# Patient Record
Sex: Female | Born: 1965 | Race: Black or African American | Hispanic: No | Marital: Married | Smoking: Never smoker
Health system: Southern US, Community
[De-identification: ages and names within clinical notes are randomized; demographics above are authoritative.]

## PROBLEM LIST (undated history)

## (undated) DIAGNOSIS — N930 Postcoital and contact bleeding: Secondary | ICD-10-CM

## (undated) DIAGNOSIS — K760 Fatty (change of) liver, not elsewhere classified: Secondary | ICD-10-CM

## (undated) DIAGNOSIS — B351 Tinea unguium: Secondary | ICD-10-CM

## (undated) DIAGNOSIS — E782 Mixed hyperlipidemia: Secondary | ICD-10-CM

## (undated) DIAGNOSIS — N760 Acute vaginitis: Secondary | ICD-10-CM

## (undated) DIAGNOSIS — R011 Cardiac murmur, unspecified: Secondary | ICD-10-CM

## (undated) DIAGNOSIS — U071 COVID-19: Secondary | ICD-10-CM

## (undated) DIAGNOSIS — B9689 Other specified bacterial agents as the cause of diseases classified elsewhere: Secondary | ICD-10-CM

## (undated) DIAGNOSIS — D170 Benign lipomatous neoplasm of skin and subcutaneous tissue of head, face and neck: Secondary | ICD-10-CM

## (undated) HISTORY — DX: Other specified bacterial agents as the cause of diseases classified elsewhere: B96.89

## (undated) HISTORY — DX: Other specified bacterial agents as the cause of diseases classified elsewhere: N76.0

## (undated) HISTORY — PX: LIPOMA EXCISION: SHX5283

## (undated) HISTORY — DX: Benign lipomatous neoplasm of skin and subcutaneous tissue of head, face and neck: D17.0

## (undated) HISTORY — DX: Tinea unguium: B35.1

## (undated) HISTORY — PX: ABDOMINAL HYSTERECTOMY: SHX81

## (undated) HISTORY — DX: Postcoital and contact bleeding: N93.0

---

## 2005-09-20 ENCOUNTER — Ambulatory Visit: Payer: Self-pay | Admitting: Family Medicine

## 2005-10-08 ENCOUNTER — Ambulatory Visit: Payer: Self-pay | Admitting: Family Medicine

## 2006-10-10 ENCOUNTER — Ambulatory Visit: Payer: Self-pay

## 2008-05-04 ENCOUNTER — Ambulatory Visit: Payer: Self-pay | Admitting: Family Medicine

## 2008-05-10 ENCOUNTER — Ambulatory Visit: Payer: Self-pay | Admitting: Family Medicine

## 2010-08-23 ENCOUNTER — Ambulatory Visit: Payer: Self-pay | Admitting: Surgery

## 2010-09-04 ENCOUNTER — Ambulatory Visit: Payer: Self-pay | Admitting: Surgery

## 2010-10-24 ENCOUNTER — Ambulatory Visit: Payer: Self-pay | Admitting: Family Medicine

## 2011-10-23 ENCOUNTER — Ambulatory Visit: Payer: Self-pay | Admitting: Family Medicine

## 2011-11-26 ENCOUNTER — Ambulatory Visit: Payer: Self-pay | Admitting: Family Medicine

## 2012-10-15 ENCOUNTER — Encounter (HOSPITAL_COMMUNITY): Payer: Self-pay | Admitting: Emergency Medicine

## 2012-10-15 ENCOUNTER — Emergency Department (HOSPITAL_COMMUNITY)
Admission: EM | Admit: 2012-10-15 | Discharge: 2012-10-15 | Disposition: A | Discharge status: Home / Self Care | Payer: BC Managed Care – PPO | Attending: Emergency Medicine | Admitting: Emergency Medicine

## 2012-10-15 DIAGNOSIS — R55 Syncope and collapse: Secondary | ICD-10-CM | POA: Insufficient documentation

## 2012-10-15 DIAGNOSIS — R011 Cardiac murmur, unspecified: Secondary | ICD-10-CM | POA: Insufficient documentation

## 2012-10-15 DIAGNOSIS — Z791 Long term (current) use of non-steroidal anti-inflammatories (NSAID): Secondary | ICD-10-CM | POA: Insufficient documentation

## 2012-10-15 DIAGNOSIS — Z79899 Other long term (current) drug therapy: Secondary | ICD-10-CM | POA: Insufficient documentation

## 2012-10-15 HISTORY — DX: Cardiac murmur, unspecified: R01.1

## 2012-10-15 LAB — CBC WITH DIFFERENTIAL/PLATELET
Basophils Absolute: 0 10*3/uL (ref 0.0–0.1)
Basophils Relative: 0 % (ref 0–1)
Eosinophils Absolute: 0.4 10*3/uL (ref 0.0–0.7)
Hemoglobin: 12.6 g/dL (ref 12.0–15.0)
MCH: 31.6 pg (ref 26.0–34.0)
MCHC: 33.4 g/dL (ref 30.0–36.0)
Monocytes Absolute: 0.6 10*3/uL (ref 0.1–1.0)
Monocytes Relative: 8 % (ref 3–12)
Neutrophils Relative %: 56 % (ref 43–77)
RDW: 13.7 % (ref 11.5–15.5)

## 2012-10-15 LAB — BASIC METABOLIC PANEL
BUN: 14 mg/dL (ref 6–23)
Creatinine, Ser: 0.98 mg/dL (ref 0.50–1.10)
GFR calc Af Amer: 80 mL/min — ABNORMAL LOW (ref >90)
GFR calc non Af Amer: 69 mL/min — ABNORMAL LOW (ref >90)
Potassium: 3.5 mEq/L (ref 3.5–5.1)

## 2012-10-15 LAB — URINALYSIS, ROUTINE W REFLEX MICROSCOPIC
Ketones, ur: NEGATIVE mg/dL
Nitrite: NEGATIVE
Urobilinogen, UA: 1 mg/dL (ref 0.0–1.0)

## 2012-10-15 LAB — URINE MICROSCOPIC-ADD ON

## 2012-10-15 NOTE — ED Notes (Addendum)
Pt had just eaten lunch when she had a syncopal episode.  Was caught by friend.  Friend stated that she had loc for 20-30 seconds.  Pt states she had a headache earlier in the day, but normal for her since she is on menstrual cycle.  Pt felt nauseous before syncopal episode.  EMS states CBG was within normal limits.  Pt alert oriented at present.  22 ga started by ems

## 2012-10-15 NOTE — ED Provider Notes (Signed)
History     CSN: [id number]  Arrival date & time 10/15/12  1508   First MD Initiated Contact with Patient 10/15/12 1512      Chief Complaint  Patient presents with  . Loss of Consciousness    (Consider location/radiation/quality/duration/timing/severity/associated sxs/prior treatment) HPI.... alleges syncopal spell at work today.  No prodromal illnesses.  Patient was standing when she felt dizzy and slumped to the floor.  Coworker states that she was out for about 20-30 seconds. She spontaneously recovered with no deficits. CBG normal via EMS.   Slight headache.  No chest pain, shortness of breath, stiff neck, obvious neuro deficits. Patient claims to be very healthy and has no chronic medical conditions.  Severity is mild to moderate.  Patient states she did not eat or drink much today.  Past Medical History  Diagnosis Date  . Heart murmur     History reviewed. No pertinent past surgical history.  History reviewed. No pertinent family history.  History  Substance Use Topics  . Smoking status: Never Smoker   . Smokeless tobacco: Not on file  . Alcohol Use: No    OB History    Grav Para Term Preterm Abortions TAB SAB Ect Mult Living                  Review of Systems  All other systems reviewed and are negative.    Allergies  Review of patient's allergies indicates no known allergies.  Home Medications   Current Outpatient Rx  Name Route Sig Dispense Refill  . IBUPROFEN 200 MG PO TABS Oral Take 200 mg by mouth every 6 (six) hours as needed. Headache    . ADULT MULTIVITAMIN W/MINERALS CH Oral Take 1 tablet by mouth daily.      BP 117/62  Pulse 57  Temp 98 F (36.7 C) (Oral)  Resp 16  SpO2 98%  Physical Exam  Nursing note and vitals reviewed. Constitutional: She is oriented to person, place, and time. She appears well-developed and well-nourished.  HENT:  Head: Normocephalic and atraumatic.  Eyes: Conjunctivae normal and EOM are normal. Pupils are  equal, round, and reactive to light.  Neck: Normal range of motion. Neck supple.  Cardiovascular: Normal rate, regular rhythm and normal heart sounds.   Pulmonary/Chest: Effort normal and breath sounds normal.  Abdominal: Soft. Bowel sounds are normal.  Musculoskeletal: Normal range of motion.  Neurological: She is alert and oriented to person, place, and time.  Skin: Skin is warm and dry.  Psychiatric: She has a normal mood and affect.    ED Course  Procedures (including critical care time)  Labs Reviewed  BASIC METABOLIC PANEL - Abnormal; Notable for the following:    GFR calc non Af Amer 69 (*)     GFR calc Af Amer 80 (*)     All other components within normal limits  URINALYSIS, ROUTINE W REFLEX MICROSCOPIC - Abnormal; Notable for the following:    APPearance CLOUDY (*)     Specific Gravity, Urine 1.043 (*)     Hgb urine dipstick LARGE (*)     Bilirubin Urine SMALL (*)     All other components within normal limits  URINE MICROSCOPIC-ADD ON - Abnormal; Notable for the following:    Squamous Epithelial / LPF FEW (*)     All other components within normal limits  CBC WITH DIFFERENTIAL  PREGNANCY, URINE   No results found.   No diagnosis found.   Date: 10/15/2012  Rate: 57  Rhythm: sinus bradycardia  QRS Axis: normal  Intervals: normal  ST/T Wave abnormalities: normal  Conduction Disutrbances:none  Narrative Interpretation:   Old EKG Reviewed: none available   MDM  Normal physical exam.   This is alert, pleasant, conversant. Good color. No anomalies.   Screening tests including EKG, hemoglobin, chemistry panel normal.        Donnetta Hutching, MD 10/15/12 1711

## 2012-10-15 NOTE — ED Notes (Signed)
Bed:WA19<BR> Expected date:<BR> Expected time:<BR> Means of arrival:<BR> Comments:<BR> ems

## 2012-10-22 ENCOUNTER — Ambulatory Visit: Payer: Self-pay | Admitting: Family Medicine

## 2012-12-23 LAB — HM PAP SMEAR

## 2013-02-03 ENCOUNTER — Ambulatory Visit: Payer: Self-pay | Admitting: Family Medicine

## 2013-10-20 ENCOUNTER — Ambulatory Visit: Payer: Self-pay | Admitting: Obstetrics and Gynecology

## 2013-10-20 LAB — BASIC METABOLIC PANEL
Anion Gap: 2 — ABNORMAL LOW (ref 7–16)
BUN: 11 mg/dL (ref 7–18)
Co2: 31 mmol/L (ref 21–32)
EGFR (Non-African Amer.): >60
Glucose: 89 mg/dL (ref 65–99)
Potassium: 4.3 mmol/L (ref 3.5–5.1)
Sodium: 138 mmol/L (ref 136–145)

## 2013-10-20 LAB — CBC
HGB: 13.2 g/dL (ref 12.0–16.0)
Platelet: 226 10*3/uL (ref 150–440)

## 2013-10-25 ENCOUNTER — Inpatient Hospital Stay: Payer: Self-pay | Admitting: Obstetrics and Gynecology

## 2013-10-26 LAB — CREATININE, SERUM
Creatinine: 0.89 mg/dL (ref 0.60–1.30)
EGFR (Non-African Amer.): >60

## 2013-10-26 LAB — PATHOLOGY REPORT

## 2013-10-26 LAB — HEMOGLOBIN: HGB: 11.4 g/dL — ABNORMAL LOW (ref 12.0–16.0)

## 2015-04-14 NOTE — Op Note (Signed)
PATIENT NAME:  Samantha Velez, Samantha Velez MR#:  [id number] DATE OF BIRTH:  01/25/1966  DATE OF PROCEDURE:  10/25/2013  PREOPERATIVE DIAGNOSIS: Multi-fibroid uterus.   POSTOPERATIVE DIAGNOSES: 1. Multi-fibroid uterus, 30 week size. 2.  Simple left ovarian cyst.   OPERATIVE PROCEDURE: Total abdominal hysterectomy and bilateral salpingectomy.   SURGEON: Alanda Slim. Nahshon Reich, MD  FIRST ASSISTANT: Dr. Marcelline Mates and Leticia Clas, PA-S  ANESTHESIA: General endotracheal.   INDICATIONS: The patient presents for definitive surgery for a multi-fibroid uterus. The patient is status post 6 months of Depo-Lupron therapy with 30% reduction in size.   FINDINGS AT SURGERY: Revealed a multi-fibroid uterus, approximately 30 weeks size. There was a simple left ovarian cyst present. The fallopian tubes were normal bilaterally.   DESCRIPTION OF PROCEDURE: The patient was brought to the operating room where she was placed in the supine position. General endotracheal anesthesia was induced with difficulty. A ChloraPrep abdominal, perineal and intravaginal prep and drape was performed in standard fashion. A Foley catheter was placed into the bladder and was draining clear yellow urine. A midline incision was extended from the subxiphoid region around the umbilicus to just above the symphysis pubis in the midline. The fascia was incised and separated and the peritoneum was entered. The abdomen was explored and the uterus was delivered through the abdominal incision. The above-noted findings were photo documented. Hysterectomy was then performed in standard fashion. The round ligaments were doubly clamped, cut and stick tied using 0 Vicryl suture. These were tagged. The utero-ovarian ligaments were clamped, cut and stick tied using 0 Vicryl suture. The anterior and posterior leafs of the broad ligament were opened and the bladder flap was created through sharp dissection. The uterine vessels were skeletonized. These were doubly clamped, cut  and stick tied using 0 Vicryl suture. Sequentially the cardinal broad ligament complexes were then taken down in standard fashion through a clamping, cutting and stick tying technique. At the level of the uterosacral ligaments, the uterus was amputated from the operative field. This facilitated better exposure of the cervix. The remainder of the cervix was then isolated through the clamping, cutting and stick tying technique down to the cervicovaginal junction. At this point, the cervix was crossclamped and excised from the operative field. The angles of the vagina were closed using Richardson stitches of 0 Vicryl. The intervening cuff was then closed with figure-of-eight sutures of 0 Vicryl. The pelvis was copiously irrigated. Good hemostasis was noted. The bilateral tubes were then removed using a curved Heaney clamp. These pedicles were then stick tied after removing the tubes from the operative field. Copious irrigation was performed. Upon completion of the procedure, all instrumentation was removed from the abdominopelvic cavity. The abdomen was closed in layers using 0 Maxon suture in a continuous manner. Several other additional figure-of-eight sutures of Maxon were also placed to optimize closure. The incision was closed with staples. Pressure dressing was applied. The patient was then awakened, extubated and taken to the recovery room in satisfactory condition. Estimated blood loss was 500 mL. IV fluids were 1600 mL. Urine output was 150 mL. All instrument, needle and sponge counts were verified as correct. The patient did receive Ancef antibiotic prophylaxis. ____________________________ Alanda Slim Matthewjames Petrasek, MD mad:sb D: 10/25/2013 09:55:24 ET T: 10/25/2013 10:51:29 ET JOB#: [id number]  cc: Hassell Done A. Riddhi Grether, MD, <Dictator> Alanda Slim Rael Tilly MD ELECTRONICALLY SIGNED 10/30/2013 20:04

## 2015-05-02 ENCOUNTER — Other Ambulatory Visit: Payer: Self-pay

## 2016-01-30 ENCOUNTER — Encounter: Payer: Self-pay | Admitting: Obstetrics and Gynecology

## 2016-10-10 ENCOUNTER — Other Ambulatory Visit: Payer: Self-pay | Admitting: Internal Medicine

## 2016-10-10 DIAGNOSIS — Z1231 Encounter for screening mammogram for malignant neoplasm of breast: Secondary | ICD-10-CM

## 2016-11-12 ENCOUNTER — Ambulatory Visit
Admission: RE | Admit: 2016-11-12 | Discharge: 2016-11-12 | Disposition: A | Discharge status: Home / Self Care | Payer: BC Managed Care – PPO | Source: Ambulatory Visit | Attending: Internal Medicine | Admitting: Internal Medicine

## 2016-11-12 DIAGNOSIS — Z1231 Encounter for screening mammogram for malignant neoplasm of breast: Secondary | ICD-10-CM | POA: Insufficient documentation

## 2017-08-04 ENCOUNTER — Other Ambulatory Visit: Payer: Self-pay | Admitting: Internal Medicine

## 2017-08-04 DIAGNOSIS — Z1231 Encounter for screening mammogram for malignant neoplasm of breast: Secondary | ICD-10-CM

## 2017-10-22 ENCOUNTER — Telehealth: Payer: Self-pay | Admitting: Gastroenterology

## 2017-10-22 ENCOUNTER — Telehealth: Payer: Self-pay

## 2017-10-22 NOTE — Telephone Encounter (Signed)
Patient left a voice message that she is returning a call to schedule her colonoscopy. Please call

## 2017-10-22 NOTE — Telephone Encounter (Signed)
Patients call has been returned LVM for her to call office to schedule colonoscopy.  Thanks [employer]

## 2017-11-17 ENCOUNTER — Ambulatory Visit
Admission: RE | Admit: 2017-11-17 | Discharge: 2017-11-17 | Disposition: A | Discharge status: Home / Self Care | Payer: BC Managed Care – PPO | Source: Ambulatory Visit | Attending: Internal Medicine | Admitting: Internal Medicine

## 2017-11-17 DIAGNOSIS — Z1231 Encounter for screening mammogram for malignant neoplasm of breast: Secondary | ICD-10-CM | POA: Insufficient documentation

## 2018-10-29 ENCOUNTER — Other Ambulatory Visit: Payer: Self-pay | Admitting: Internal Medicine

## 2018-10-29 DIAGNOSIS — Z1231 Encounter for screening mammogram for malignant neoplasm of breast: Secondary | ICD-10-CM

## 2018-11-25 ENCOUNTER — Ambulatory Visit
Admission: RE | Admit: 2018-11-25 | Discharge: 2018-11-25 | Disposition: A | Discharge status: Home / Self Care | Payer: BC Managed Care – PPO | Source: Ambulatory Visit | Attending: Internal Medicine | Admitting: Internal Medicine

## 2018-11-25 DIAGNOSIS — Z1231 Encounter for screening mammogram for malignant neoplasm of breast: Secondary | ICD-10-CM | POA: Insufficient documentation

## 2020-01-03 ENCOUNTER — Other Ambulatory Visit: Payer: Self-pay | Admitting: Internal Medicine

## 2020-01-03 DIAGNOSIS — Z1231 Encounter for screening mammogram for malignant neoplasm of breast: Secondary | ICD-10-CM

## 2020-01-24 ENCOUNTER — Ambulatory Visit
Admission: RE | Admit: 2020-01-24 | Discharge: 2020-01-24 | Disposition: A | Discharge status: Home / Self Care | Payer: BC Managed Care – PPO | Source: Ambulatory Visit | Attending: Internal Medicine | Admitting: Internal Medicine

## 2020-01-24 DIAGNOSIS — Z1231 Encounter for screening mammogram for malignant neoplasm of breast: Secondary | ICD-10-CM | POA: Diagnosis not present

## 2020-03-28 ENCOUNTER — Other Ambulatory Visit: Payer: Self-pay | Admitting: Family

## 2020-03-28 DIAGNOSIS — H47339 Pseudopapilledema of optic disc, unspecified eye: Secondary | ICD-10-CM

## 2020-04-11 ENCOUNTER — Ambulatory Visit
Admission: RE | Admit: 2020-04-11 | Discharge: 2020-04-11 | Disposition: A | Discharge status: Home / Self Care | Payer: BC Managed Care – PPO | Source: Ambulatory Visit | Attending: Family | Admitting: Family

## 2020-04-11 ENCOUNTER — Other Ambulatory Visit: Payer: Self-pay

## 2020-04-11 DIAGNOSIS — H47339 Pseudopapilledema of optic disc, unspecified eye: Secondary | ICD-10-CM | POA: Insufficient documentation

## 2020-04-11 MED ORDER — GADOBUTROL 1 MMOL/ML IV SOLN
10.0000 mL | Freq: Once | INTRAVENOUS | Status: AC | PRN
  Administered 2020-04-11: 14:00:00 10 mL via INTRAVENOUS

## 2021-02-06 ENCOUNTER — Other Ambulatory Visit: Payer: Self-pay | Admitting: Internal Medicine

## 2021-02-06 DIAGNOSIS — Z1231 Encounter for screening mammogram for malignant neoplasm of breast: Secondary | ICD-10-CM

## 2021-02-27 ENCOUNTER — Ambulatory Visit
Admission: RE | Admit: 2021-02-27 | Discharge: 2021-02-27 | Disposition: A | Discharge status: Home / Self Care | Payer: BC Managed Care – PPO | Source: Ambulatory Visit | Attending: Internal Medicine | Admitting: Internal Medicine

## 2021-02-27 ENCOUNTER — Other Ambulatory Visit: Payer: Self-pay

## 2021-02-27 DIAGNOSIS — Z1231 Encounter for screening mammogram for malignant neoplasm of breast: Secondary | ICD-10-CM | POA: Diagnosis present

## 2022-05-07 ENCOUNTER — Other Ambulatory Visit: Payer: Self-pay | Admitting: Internal Medicine

## 2022-05-07 DIAGNOSIS — Z1231 Encounter for screening mammogram for malignant neoplasm of breast: Secondary | ICD-10-CM

## 2022-06-05 ENCOUNTER — Ambulatory Visit
Admission: RE | Admit: 2022-06-05 | Discharge: 2022-06-05 | Disposition: A | Discharge status: Home / Self Care | Payer: BC Managed Care – PPO | Source: Ambulatory Visit | Attending: Internal Medicine | Admitting: Internal Medicine

## 2022-06-05 DIAGNOSIS — Z1231 Encounter for screening mammogram for malignant neoplasm of breast: Secondary | ICD-10-CM | POA: Diagnosis present

## 2022-09-01 ENCOUNTER — Encounter: Payer: Self-pay | Admitting: Internal Medicine

## 2023-04-09 ENCOUNTER — Encounter: Payer: Self-pay | Admitting: Nurse Practitioner

## 2023-04-09 ENCOUNTER — Ambulatory Visit (INDEPENDENT_AMBULATORY_CARE_PROVIDER_SITE_OTHER): Payer: BC Managed Care – PPO | Admitting: Nurse Practitioner

## 2023-04-09 VITALS — BP 150/90 | HR 78 | Ht 65.0 in | Wt 241.6 lb

## 2023-04-09 DIAGNOSIS — H1033 Unspecified acute conjunctivitis, bilateral: Secondary | ICD-10-CM | POA: Diagnosis not present

## 2023-04-09 MED ORDER — TOBRAMYCIN-DEXAMETHASONE 0.3-0.1 % OP SUSP: 1.0000 [drp] | Freq: Four times a day (QID) | OPHTHALMIC | 0 refills | Status: DC

## 2023-04-09 NOTE — Patient Instructions (Signed)
1) washcloths and pillowcases switched out daily 2) eye drop

## 2023-04-09 NOTE — Progress Notes (Signed)
Established Patient Office Visit  Subjective:  Patient ID: Samantha Velez, female    DOB: [birth year]  Age: 56 y.o. MRN: [id number]  Chief Complaint  Patient presents with   Acute Visit    Eyes swollen and crusty    Acute visit, bilateral sclera pink when patient woke up this AM.  Both eyes were crusted over when she awoke. Draining some serous drainage from both eyes.  Right eye is sore but not painful.      No other concerns at this time.   Past Medical History:  Diagnosis Date   BV (bacterial vaginosis)    Heart murmur    Lipoma, face    Onychomycosis    Postcoital bleeding     Past Surgical History:  Procedure Laterality Date   ABDOMINAL HYSTERECTOMY     [place] bilateral salpingectomy   LIPOMA EXCISION     back    Social History   Socioeconomic History   Marital status: Married    Spouse name: Not on file   Number of children: Not on file   Years of education: Not on file   Highest education level: Not on file  Occupational History   Not on file  Tobacco Use   Smoking status: Never   Smokeless tobacco: Not on file  Substance and Sexual Activity   Alcohol use: No   Drug use: No   Sexual activity: Yes    Birth control/protection: Surgical  Other Topics Concern   Not on file  Social History Narrative   Not on file   Social Determinants of Health   Financial Resource Strain: Not on file  Food Insecurity: Not on file  Transportation Needs: Not on file  Physical Activity: Not on file  Stress: Not on file  Social Connections: Not on file  Intimate Partner Violence: Not on file    Family History  Problem Relation Age of Onset   Hypertension Father    Breast cancer Paternal Aunt    Colon cancer Paternal Grandfather    Ovarian cancer Neg Hx     No Known Allergies  Review of Systems  Constitutional: Negative.   HENT: Negative.    Eyes:  Positive for photophobia, pain, discharge and redness.  Respiratory: Negative.    Cardiovascular: Negative.    Gastrointestinal: Negative.   Genitourinary: Negative.   Musculoskeletal: Negative.   Skin: Negative.   Neurological: Negative.   Endo/Heme/Allergies: Negative.   Psychiatric/Behavioral: Negative.         Objective:   BP (!) 150/90   Pulse 78   Ht  (1.651 m)   Wt 241 lb 9.6 oz (109.6 kg)   SpO2 97%   BMI 40.20 kg/m   Vitals:   04/09/23 1021  BP: (!) 150/90  Pulse: 78  Height:  (1.651 m)  Weight: 241 lb 9.6 oz (109.6 kg)  SpO2: 97%  BMI (Calculated): 40.2    Physical Exam Vitals reviewed.  Constitutional:      Appearance: Normal appearance.  HENT:     Head: Normocephalic.     Nose: Nose normal.     Mouth/Throat:     Mouth: Mucous membranes are moist.  Eyes:     General:        Right eye: Discharge present.        Left eye: Discharge present. Cardiovascular:     Rate and Rhythm: Normal rate and regular rhythm.  Pulmonary:     Effort: Pulmonary effort is normal.  Breath sounds: Normal breath sounds.  Abdominal:     General: Bowel sounds are normal.     Palpations: Abdomen is soft.  Musculoskeletal:        General: Normal range of motion.     Cervical back: Normal range of motion and neck supple.  Skin:    General: Skin is warm and dry.  Neurological:     Mental Status: She is alert and oriented to person, place, and time.  Psychiatric:        Mood and Affect: Mood normal.        Behavior: Behavior normal.      No results found for any visits on 04/09/23.  No results found for this or any previous visit (from the past 2160 hour(s)).    Assessment & Plan:   Problem List Items Addressed This Visit       Other   Acute bacterial conjunctivitis of both eyes - Primary   Relevant Medications   tobramycin-dexamethasone (TOBRADEX) ophthalmic solution    No follow-ups on file.   Total time spent: 15 minutes  Orson Eva, NP  04/09/2023

## 2023-05-01 ENCOUNTER — Ambulatory Visit (INDEPENDENT_AMBULATORY_CARE_PROVIDER_SITE_OTHER): Payer: BC Managed Care – PPO

## 2023-05-01 ENCOUNTER — Encounter: Payer: Self-pay | Admitting: Internal Medicine

## 2023-05-01 ENCOUNTER — Other Ambulatory Visit: Payer: Self-pay | Admitting: Internal Medicine

## 2023-05-01 ENCOUNTER — Ambulatory Visit: Payer: BC Managed Care – PPO | Admitting: Internal Medicine

## 2023-05-01 VITALS — BP 130/68 | HR 73 | Ht 65.0 in | Wt 240.0 lb

## 2023-05-01 DIAGNOSIS — R1011 Right upper quadrant pain: Secondary | ICD-10-CM

## 2023-05-01 DIAGNOSIS — R1084 Generalized abdominal pain: Secondary | ICD-10-CM

## 2023-05-01 DIAGNOSIS — Z1389 Encounter for screening for other disorder: Secondary | ICD-10-CM

## 2023-05-01 DIAGNOSIS — E669 Obesity, unspecified: Secondary | ICD-10-CM

## 2023-05-01 DIAGNOSIS — K5909 Other constipation: Secondary | ICD-10-CM

## 2023-05-01 DIAGNOSIS — E782 Mixed hyperlipidemia: Secondary | ICD-10-CM

## 2023-05-01 DIAGNOSIS — R030 Elevated blood-pressure reading, without diagnosis of hypertension: Secondary | ICD-10-CM | POA: Insufficient documentation

## 2023-05-01 LAB — POCT URINALYSIS DIPSTICK
Blood, UA: NEGATIVE
Glucose, UA: NEGATIVE
Leukocytes, UA: NEGATIVE
Nitrite, UA: NEGATIVE
Protein, UA: POSITIVE — AB
Spec Grav, UA: 1.015 (ref 1.010–1.025)
Urobilinogen, UA: 2 E.U./dL — AB
pH, UA: 7 (ref 5.0–8.0)

## 2023-05-01 NOTE — Progress Notes (Signed)
Established Patient Office Visit  Subjective:  Patient ID: Samantha Velez, female    DOB: Feb 25, 1966  Age: 57 y.o. MRN: [id number]  Chief Complaint  Patient presents with   Follow-up    Abdominal pain/dark urine    Patient not in since 2021.  She has been doing fine except for COVID infection in September 2023. Today she comes in with 3-day history of abdominal pain which is mostly diffuse and generalized.  She is more uncomfortable than in pain.  She noticed that she has not had a bowel movement for the last 2 days and she is usually very regular every day.  Although she is passing gas.  She denies any nausea or vomiting, no fevers and chills, and no diarrhea or urinary complaints.  Her appetite is normal. She did not eat anything unusual and nobody else in her household is sick. Currently she is not taking any medications except for multivitamin.  No change in diet or supplements. Patient has gained weight since her last visit.  Her blood pressure is still borderline high. Will check x-ray abdomen, and blood work.    No other concerns at this time.   Past Medical History:  Diagnosis Date   BV (bacterial vaginosis)    Heart murmur    Lipoma, face    Onychomycosis    Postcoital bleeding     Past Surgical History:  Procedure Laterality Date   ABDOMINAL HYSTERECTOMY     [place] bilateral salpingectomy   LIPOMA EXCISION     back    Social History   Socioeconomic History   Marital status: Married    Spouse name: Not on file   Number of children: Not on file   Years of education: Not on file   Highest education level: Not on file  Occupational History   Not on file  Tobacco Use   Smoking status: Never   Smokeless tobacco: Not on file  Substance and Sexual Activity   Alcohol use: No   Drug use: No   Sexual activity: Yes    Birth control/protection: Surgical  Other Topics Concern   Not on file  Social History Narrative   Not on file   Social Determinants of Health    Financial Resource Strain: Not on file  Food Insecurity: Not on file  Transportation Needs: Not on file  Physical Activity: Not on file  Stress: Not on file  Social Connections: Not on file  Intimate Partner Violence: Not on file    Family History  Problem Relation Age of Onset   Hypertension Father    Breast cancer Paternal Aunt    Colon cancer Paternal Grandfather    Ovarian cancer Neg Hx     No Known Allergies  Review of Systems  Constitutional:  Negative for fever.  HENT: Negative.    Eyes: Negative.   Respiratory:  Negative for cough, shortness of breath and wheezing.   Cardiovascular:  Negative for chest pain, leg swelling and PND.  Gastrointestinal:  Positive for abdominal pain and constipation. Negative for blood in stool, diarrhea, heartburn, melena, nausea and vomiting.  Genitourinary:  Negative for dysuria, flank pain, hematuria and urgency.  Musculoskeletal:  Negative for back pain, falls, joint pain, myalgias and neck pain.  Neurological:  Negative for dizziness, tingling, tremors, weakness and headaches.  Psychiatric/Behavioral:  Negative for depression. The patient is not nervous/anxious and does not have insomnia.        Objective:   BP 130/68   Pulse  73   Ht 5\' 5"  (1.651 m)   Wt 240 lb (108.9 kg)   SpO2 96%   BMI 39.94 kg/m   Vitals:   05/01/23 1050  BP: 130/68  Pulse: 73  Height: 5\' 5"  (1.651 m)  Weight: 240 lb (108.9 kg)  SpO2: 96%  BMI (Calculated): 39.94    Physical Exam Vitals and nursing note reviewed.  Constitutional:      Appearance: She is obese.  Cardiovascular:     Rate and Rhythm: Normal rate and regular rhythm.     Pulses: Normal pulses.     Heart sounds: Normal heart sounds. No murmur heard. Pulmonary:     Effort: Pulmonary effort is normal.     Breath sounds: Normal breath sounds. No wheezing, rhonchi or rales.  Abdominal:     General: Bowel sounds are normal. There is no distension.     Palpations: Abdomen is  soft. There is no mass.     Tenderness: There is abdominal tenderness (mild diffuse tenderness.). There is no right CVA tenderness, left CVA tenderness, guarding or rebound.     Hernia: No hernia is present.  Musculoskeletal:        General: Normal range of motion.     Cervical back: Normal range of motion and neck supple.     Right lower leg: No edema.     Left lower leg: No edema.  Skin:    General: Skin is warm.  Neurological:     General: No focal deficit present.     Mental Status: She is alert and oriented to person, place, and time.  Psychiatric:        Mood and Affect: Mood normal.        Behavior: Behavior normal.      Results for orders placed or performed in visit on 05/01/23  POCT Urinalysis Dipstick (81002)  Result Value Ref Range   Color, UA     Clarity, UA     Glucose, UA Negative Negative   Bilirubin, UA Moderate    Ketones, UA 15mg /dl    Spec Grav, UA 1.610 1.010 - 1.025   Blood, UA Negative    pH, UA 7.0 5.0 - 8.0   Protein, UA Positive (A) Negative   Urobilinogen, UA 2.0 (A) 0.2 or 1.0 E.U./dL   Nitrite, UA Negative    Leukocytes, UA Negative Negative   Appearance     Odor      Recent Results (from the past 2160 hour(s))  POCT Urinalysis Dipstick (96045)     Status: Abnormal   Collection Time: 05/01/23 10:56 AM  Result Value Ref Range   Color, UA     Clarity, UA     Glucose, UA Negative Negative   Bilirubin, UA Moderate    Ketones, UA 15mg /dl    Spec Grav, UA 4.098 1.010 - 1.025   Blood, UA Negative    pH, UA 7.0 5.0 - 8.0   Protein, UA Positive (A) Negative    Comment: Trace   Urobilinogen, UA 2.0 (A) 0.2 or 1.0 E.U./dL   Nitrite, UA Negative    Leukocytes, UA Negative Negative   Appearance     Odor        Assessment & Plan:  Patient had her abdominal x-ray done in the office which shows that she is negative for bowel obstruction, perforation and constipation however there is possible cholelithiasis as a 3.5 cm ovoid structure is seen  in the right upper quadrant.  This information was  shared with the patient on the phone.  An abdominal ultrasound is being scheduled.  Patient will take oral stool softeners, drink plenty of fluids and keep her diet simple.  She will let us know if anything changes. Problem List Items Addressed This Visit     Generalized abdominal pain - Primary   Relevant Orders   CBC With Differential   CMP14+EGFR   Lipase   Mixed hyperlipidemia   Relevant Orders   Lipid Panel w/o Chol/HDL Ratio   Prehypertension   Obesity (BMI 30-39.9)   Other constipation   Relevant Orders   DG Abd 2 Views (Completed)   CMP14+EGFR   Other Visit Diagnoses     Screening for blood or protein in urine       Relevant Orders   POCT Urinalysis Dipstick (16109) (Completed)   Right upper quadrant abdominal pain       Relevant Orders   US Abdomen Limited RUQ (LIVER/GB)       Return in about 1 week (around 05/08/2023).   Total time spent: 30 minutes  Margaretann Loveless, MD  05/01/2023

## 2023-05-02 LAB — CBC WITH DIFFERENTIAL
Basophils Absolute: 0 10*3/uL (ref 0.0–0.2)
Basos: 0 %
EOS (ABSOLUTE): 0.2 10*3/uL (ref 0.0–0.4)
Eos: 2 %
Hematocrit: 38.9 % (ref 34.0–46.6)
Hemoglobin: 12.7 g/dL (ref 11.1–15.9)
Immature Grans (Abs): 0.1 10*3/uL (ref 0.0–0.1)
Immature Granulocytes: 1 %
Lymphocytes Absolute: 2.5 10*3/uL (ref 0.7–3.1)
Lymphs: 32 %
MCH: 30.2 pg (ref 26.6–33.0)
MCHC: 32.6 g/dL (ref 31.5–35.7)
MCV: 93 fL (ref 79–97)
Monocytes Absolute: 0.6 10*3/uL (ref 0.1–0.9)
Monocytes: 8 %
Neutrophils Absolute: 4.5 10*3/uL (ref 1.4–7.0)
Neutrophils: 57 %
RBC: 4.2 x10E6/uL (ref 3.77–5.28)
RDW: 13.6 % (ref 11.7–15.4)
WBC: 7.8 10*3/uL (ref 3.4–10.8)

## 2023-05-02 LAB — LIPID PANEL W/O CHOL/HDL RATIO
Cholesterol, Total: 248 mg/dL — ABNORMAL HIGH (ref 100–199)
HDL: 57 mg/dL (ref >39)
LDL Chol Calc (NIH): 178 mg/dL — ABNORMAL HIGH (ref 0–99)
Triglycerides: 76 mg/dL (ref 0–149)
VLDL Cholesterol Cal: 13 mg/dL (ref 5–40)

## 2023-05-02 LAB — CMP14+EGFR
ALT: 692 IU/L (ref 0–32)
AST: 435 IU/L — ABNORMAL HIGH (ref 0–40)
Albumin/Globulin Ratio: 1.5 (ref 1.2–2.2)
Albumin: 4 g/dL (ref 3.8–4.9)
Alkaline Phosphatase: 254 IU/L — ABNORMAL HIGH (ref 44–121)
BUN/Creatinine Ratio: 9 (ref 9–23)
BUN: 7 mg/dL (ref 6–24)
Bilirubin Total: 2.7 mg/dL — ABNORMAL HIGH (ref 0.0–1.2)
CO2: 21 mmol/L (ref 20–29)
Calcium: 9.5 mg/dL (ref 8.7–10.2)
Chloride: 103 mmol/L (ref 96–106)
Creatinine, Ser: 0.81 mg/dL (ref 0.57–1.00)
Globulin, Total: 2.7 g/dL (ref 1.5–4.5)
Glucose: 91 mg/dL (ref 70–99)
Potassium: 3.9 mmol/L (ref 3.5–5.2)
Sodium: 141 mmol/L (ref 134–144)
Total Protein: 6.7 g/dL (ref 6.0–8.5)
eGFR: 86 mL/min/{1.73_m2} (ref >59)

## 2023-05-02 LAB — LIPASE: Lipase: 20 U/L (ref 14–72)

## 2023-05-05 ENCOUNTER — Ambulatory Visit
Admission: RE | Admit: 2023-05-05 | Discharge: 2023-05-05 | Disposition: A | Discharge status: Home / Self Care | Payer: BC Managed Care – PPO | Source: Ambulatory Visit | Attending: Internal Medicine | Admitting: Internal Medicine

## 2023-05-05 ENCOUNTER — Other Ambulatory Visit: Payer: Self-pay | Admitting: Internal Medicine

## 2023-05-05 DIAGNOSIS — R748 Abnormal levels of other serum enzymes: Secondary | ICD-10-CM

## 2023-05-05 DIAGNOSIS — R1011 Right upper quadrant pain: Secondary | ICD-10-CM | POA: Diagnosis present

## 2023-05-07 ENCOUNTER — Telehealth: Payer: Self-pay | Admitting: Internal Medicine

## 2023-05-07 NOTE — Telephone Encounter (Signed)
Patient returned call regarding her U/S results. Advised her of Dr. Milta Deiters message. Patient will come for labs again on Monday, 5/20 when she comes for her appt. Advised her we are referring her to [facility] for surgical consult. Patient verbalized understanding.

## 2023-05-12 ENCOUNTER — Encounter: Payer: Self-pay | Admitting: Internal Medicine

## 2023-05-12 ENCOUNTER — Ambulatory Visit (INDEPENDENT_AMBULATORY_CARE_PROVIDER_SITE_OTHER): Payer: BC Managed Care – PPO | Admitting: Internal Medicine

## 2023-05-12 VITALS — BP 124/80 | HR 71 | Ht 65.0 in | Wt 240.0 lb

## 2023-05-12 DIAGNOSIS — E669 Obesity, unspecified: Secondary | ICD-10-CM

## 2023-05-12 DIAGNOSIS — E782 Mixed hyperlipidemia: Secondary | ICD-10-CM

## 2023-05-12 DIAGNOSIS — R1084 Generalized abdominal pain: Secondary | ICD-10-CM

## 2023-05-12 DIAGNOSIS — R748 Abnormal levels of other serum enzymes: Secondary | ICD-10-CM | POA: Insufficient documentation

## 2023-05-12 NOTE — Progress Notes (Signed)
Established Patient Office Visit  Subjective:  Patient ID: Samantha Velez, female    DOB: 13-May-1966  Age: 57 y.o. MRN: [id number]  Chief Complaint  Patient presents with   Follow-up    1 week follow up    Patient comes in for follow-up from her last visit when she had presented with abdominal discomfort and generalized bloating and constipation.  Initial x-ray of the abdomen had shown no obstruction but there was a large gallstone.  Patient was scheduled for abdominal ultrasound which still showed a gallstone and a 2.5 cm liver cyst, and fatty liver.  There was very mild thickening of the gallbladder but not acute cholecystitis.  However her liver enzymes were markedly elevated.  As well as her LDL.  More labs were ordered which patient will get done today including repeat LFTs. Today patient is feeling better she had a good bowel movement with using Dulcolax tablets so she is not constipated anymore.  She is not bloated but she still feels some generalized discomfort in her abdomen. Will consider HIDA scan and a surgical consult after we get the lab results back. She has no nausea vomiting.  No fevers or chills.  No diarrhea.  Her appetite is good. Patient needs to start a very strict diet control for cholesterol , will also consider adding statin.  Suspect NAFLD.    No other concerns at this time.   Past Medical History:  Diagnosis Date   BV (bacterial vaginosis)    Heart murmur    Lipoma, face    Onychomycosis    Postcoital bleeding     Past Surgical History:  Procedure Laterality Date   ABDOMINAL HYSTERECTOMY     [place] bilateral salpingectomy   LIPOMA EXCISION     back    Social History   Socioeconomic History   Marital status: Married    Spouse name: Not on file   Number of children: Not on file   Years of education: Not on file   Highest education level: Not on file  Occupational History   Not on file  Tobacco Use   Smoking status: Never   Smokeless tobacco:  Not on file  Substance and Sexual Activity   Alcohol use: No   Drug use: No   Sexual activity: Yes    Birth control/protection: Surgical  Other Topics Concern   Not on file  Social History Narrative   Not on file   Social Determinants of Health   Financial Resource Strain: Not on file  Food Insecurity: Not on file  Transportation Needs: Not on file  Physical Activity: Not on file  Stress: Not on file  Social Connections: Not on file  Intimate Partner Violence: Not on file    Family History  Problem Relation Age of Onset   Hypertension Father    Breast cancer Paternal Aunt    Colon cancer Paternal Grandfather    Ovarian cancer Neg Hx     No Known Allergies  Review of Systems  Constitutional:  Negative for chills, diaphoresis, fever, malaise/fatigue and weight loss.  HENT:  Negative for congestion, hearing loss, sinus pain, sore throat and tinnitus.   Eyes: Negative.   Respiratory:  Negative for cough, shortness of breath and wheezing.   Cardiovascular:  Negative for chest pain, leg swelling and PND.  Gastrointestinal:  Negative for abdominal pain, blood in stool, constipation, diarrhea, heartburn, melena, nausea and vomiting.  Musculoskeletal:  Negative for back pain, falls, joint pain, myalgias and neck  pain.  Neurological:  Negative for dizziness.  Psychiatric/Behavioral: Negative.         Objective:   BP 124/80   Pulse 71   Ht 5\' 5"  (1.651 m)   Wt 240 lb (108.9 kg)   SpO2 97%   BMI 39.94 kg/m   Vitals:   05/12/23 1443  BP: 124/80  Pulse: 71  Height: 5\' 5"  (1.651 m)  Weight: 240 lb (108.9 kg)  SpO2: 97%  BMI (Calculated): 39.94    Physical Exam Vitals and nursing note reviewed.  Constitutional:      Appearance: Normal appearance. She is obese.  Cardiovascular:     Rate and Rhythm: Normal rate and regular rhythm.     Pulses: Normal pulses.     Heart sounds: Normal heart sounds.  Pulmonary:     Effort: Pulmonary effort is normal.     Breath  sounds: Normal breath sounds. No wheezing, rhonchi or rales.  Abdominal:     General: Bowel sounds are normal. There is no distension.     Palpations: Abdomen is soft. There is no mass.     Tenderness: There is no abdominal tenderness. There is no right CVA tenderness, left CVA tenderness, guarding or rebound.     Hernia: No hernia is present.  Musculoskeletal:        General: No swelling, deformity or signs of injury. Normal range of motion.     Cervical back: Normal range of motion and neck supple.     Right lower leg: No edema.     Left lower leg: No edema.  Skin:    General: Skin is warm.  Neurological:     General: No focal deficit present.     Mental Status: She is alert and oriented to person, place, and time.  Psychiatric:        Mood and Affect: Mood normal.      No results found for any visits on 05/12/23.  Recent Results (from the past 2160 hour(s))  POCT Urinalysis Dipstick (09811)     Status: Abnormal   Collection Time: 05/01/23 10:56 AM  Result Value Ref Range   Color, UA     Clarity, UA     Glucose, UA Negative Negative   Bilirubin, UA Moderate    Ketones, UA 15mg /dl    Spec Grav, UA 9.147 1.010 - 1.025   Blood, UA Negative    pH, UA 7.0 5.0 - 8.0   Protein, UA Positive (A) Negative    Comment: Trace   Urobilinogen, UA 2.0 (A) 0.2 or 1.0 E.U./dL   Nitrite, UA Negative    Leukocytes, UA Negative Negative   Appearance     Odor    CBC With Differential     Status: None   Collection Time: 05/01/23 11:31 AM  Result Value Ref Range   WBC 7.8 3.4 - 10.8 x10E3/uL   RBC 4.20 3.77 - 5.28 x10E6/uL   Hemoglobin 12.7 11.1 - 15.9 g/dL   Hematocrit 82.9 56.2 - 46.6 %   MCV 93 79 - 97 fL   MCH 30.2 26.6 - 33.0 pg   MCHC 32.6 31.5 - 35.7 g/dL   RDW 13.0 86.5 - 78.4 %   Neutrophils 57 Not Estab. %   Lymphs 32 Not Estab. %   Monocytes 8 Not Estab. %   Eos 2 Not Estab. %   Basos 0 Not Estab. %   Neutrophils Absolute 4.5 1.4 - 7.0 x10E3/uL   Lymphocytes Absolute  2.5 0.7 -  3.1 x10E3/uL   Monocytes Absolute 0.6 0.1 - 0.9 x10E3/uL   EOS (ABSOLUTE) 0.2 0.0 - 0.4 x10E3/uL   Basophils Absolute 0.0 0.0 - 0.2 x10E3/uL   Immature Granulocytes 1 Not Estab. %   Immature Grans (Abs) 0.1 0.0 - 0.1 x10E3/uL  CMP14+EGFR     Status: Abnormal   Collection Time: 05/01/23 11:31 AM  Result Value Ref Range   Glucose 91 70 - 99 mg/dL   BUN 7 6 - 24 mg/dL   Creatinine, Ser 1.91 0.57 - 1.00 mg/dL   eGFR 86 >47 WG/NFA/2.13   BUN/Creatinine Ratio 9 9 - 23   Sodium 141 134 - 144 mmol/L   Potassium 3.9 3.5 - 5.2 mmol/L   Chloride 103 96 - 106 mmol/L   CO2 21 20 - 29 mmol/L   Calcium 9.5 8.7 - 10.2 mg/dL   Total Protein 6.7 6.0 - 8.5 g/dL   Albumin 4.0 3.8 - 4.9 g/dL   Globulin, Total 2.7 1.5 - 4.5 g/dL   Albumin/Globulin Ratio 1.5 1.2 - 2.2   Bilirubin Total 2.7 (H) 0.0 - 1.2 mg/dL   Alkaline Phosphatase 254 (H) 44 - 121 IU/L   AST 435 (H) 0 - 40 IU/L   ALT 692 (HH) 0 - 32 IU/L  Lipase     Status: None   Collection Time: 05/01/23 11:31 AM  Result Value Ref Range   Lipase 20 14 - 72 U/L  Lipid Panel w/o Chol/HDL Ratio     Status: Abnormal   Collection Time: 05/01/23 11:31 AM  Result Value Ref Range   Cholesterol, Total 248 (H) 100 - 199 mg/dL   Triglycerides 76 0 - 149 mg/dL   HDL 57 >08 mg/dL   VLDL Cholesterol Cal 13 5 - 40 mg/dL   LDL Chol Calc (NIH) 657 (H) 0 - 99 mg/dL      Assessment & Plan:  Repeat labs today.  Consider HIDA scan.  May need surgical and gastroenterology consult. Problem List Items Addressed This Visit     Generalized abdominal pain   Mixed hyperlipidemia   Obesity (BMI 30-39.9)   Abnormal liver enzymes - Primary    Return in about 4 weeks (around 06/09/2023).   Total time spent: 30 minutes  Margaretann Loveless, MD  05/12/2023   This document may have been prepared by [facility] Voice Recognition software and as such may include unintentional dictation errors.

## 2023-05-13 ENCOUNTER — Other Ambulatory Visit: Payer: Self-pay | Admitting: Internal Medicine

## 2023-05-13 DIAGNOSIS — R748 Abnormal levels of other serum enzymes: Secondary | ICD-10-CM

## 2023-05-13 DIAGNOSIS — R1013 Epigastric pain: Secondary | ICD-10-CM

## 2023-05-13 LAB — ACUTE HEP PANEL AND HEP B SURFACE AB
Hep A IgM: NEGATIVE
Hep B C IgM: NEGATIVE
Hep C Virus Ab: NONREACTIVE
Hepatitis B Surf Ab Quant: 9093 m[IU]/mL (ref >9.9)
Hepatitis B Surface Ag: NEGATIVE

## 2023-05-13 LAB — HEPATIC FUNCTION PANEL
ALT: 63 IU/L — ABNORMAL HIGH (ref 0–32)
AST: 23 IU/L (ref 0–40)
Albumin: 4.2 g/dL (ref 3.8–4.9)
Alkaline Phosphatase: 142 IU/L — ABNORMAL HIGH (ref 44–121)
Bilirubin Total: 0.3 mg/dL (ref 0.0–1.2)
Bilirubin, Direct: 0.13 mg/dL (ref 0.00–0.40)
Total Protein: 7.1 g/dL (ref 6.0–8.5)

## 2023-05-13 MED ORDER — PANTOPRAZOLE SODIUM 40 MG PO TBEC: 40.0000 mg | DELAYED_RELEASE_TABLET | Freq: Every day | ORAL | 0 refills | Status: DC

## 2023-06-02 ENCOUNTER — Other Ambulatory Visit: Payer: BC Managed Care – PPO

## 2023-06-03 ENCOUNTER — Ambulatory Visit
Admission: RE | Admit: 2023-06-03 | Discharge: 2023-06-03 | Disposition: A | Discharge status: Home / Self Care | Payer: BC Managed Care – PPO | Source: Ambulatory Visit | Attending: Internal Medicine | Admitting: Internal Medicine

## 2023-06-03 DIAGNOSIS — R748 Abnormal levels of other serum enzymes: Secondary | ICD-10-CM | POA: Diagnosis present

## 2023-06-03 DIAGNOSIS — R1013 Epigastric pain: Secondary | ICD-10-CM | POA: Insufficient documentation

## 2023-06-03 MED ORDER — TECHNETIUM TC 99M MEBROFENIN IV KIT
5.0000 | PACK | Freq: Once | INTRAVENOUS | Status: AC | PRN
  Administered 2023-06-03: 5.24 via INTRAVENOUS

## 2023-06-09 ENCOUNTER — Ambulatory Visit (INDEPENDENT_AMBULATORY_CARE_PROVIDER_SITE_OTHER): Payer: BC Managed Care – PPO | Admitting: Internal Medicine

## 2023-06-09 ENCOUNTER — Encounter: Payer: Self-pay | Admitting: Internal Medicine

## 2023-06-09 VITALS — BP 118/86 | HR 76 | Ht 65.0 in | Wt 236.6 lb

## 2023-06-09 DIAGNOSIS — E782 Mixed hyperlipidemia: Secondary | ICD-10-CM | POA: Diagnosis not present

## 2023-06-09 DIAGNOSIS — K5909 Other constipation: Secondary | ICD-10-CM | POA: Diagnosis not present

## 2023-06-09 DIAGNOSIS — K8020 Calculus of gallbladder without cholecystitis without obstruction: Secondary | ICD-10-CM | POA: Insufficient documentation

## 2023-06-09 MED ORDER — ROSUVASTATIN CALCIUM 10 MG PO TABS: 10.0000 mg | ORAL_TABLET | Freq: Every day | ORAL | 11 refills | Status: DC

## 2023-06-09 NOTE — Progress Notes (Signed)
Established Patient Office Visit  Subjective:  Patient ID: Samantha Velez, female    DOB: 04/26/1966  Age: 57 y.o. MRN: [id number]  Chief Complaint  Patient presents with   Follow-up    4 week follow up    Patient comes in for follow up and to discuss HIDA results. Her Gallbladder EF is normal but delayed filling due to large gallstone.  He recent labs showed improvement in LFTs, but has high LDL. Will start Crestor. Patient already started strict diet control and lost some weight. However will schedule surgical evaluation for gall stones. No n/v ,no chest pain, no diarrhea or constipation.    No other concerns at this time.   Past Medical History:  Diagnosis Date   BV (bacterial vaginosis)    Heart murmur    Lipoma, face    Onychomycosis    Postcoital bleeding     Past Surgical History:  Procedure Laterality Date   ABDOMINAL HYSTERECTOMY     [place] bilateral salpingectomy   LIPOMA EXCISION     back    Social History   Socioeconomic History   Marital status: Married    Spouse name: Not on file   Number of children: Not on file   Years of education: Not on file   Highest education level: Not on file  Occupational History   Not on file  Tobacco Use   Smoking status: Never   Smokeless tobacco: Not on file  Substance and Sexual Activity   Alcohol use: No   Drug use: No   Sexual activity: Yes    Birth control/protection: Surgical  Other Topics Concern   Not on file  Social History Narrative   Not on file   Social Determinants of Health   Financial Resource Strain: Not on file  Food Insecurity: Not on file  Transportation Needs: Not on file  Physical Activity: Not on file  Stress: Not on file  Social Connections: Not on file  Intimate Partner Violence: Not on file    Family History  Problem Relation Age of Onset   Hypertension Father    Breast cancer Paternal Aunt    Colon cancer Paternal Grandfather    Ovarian cancer Neg Hx     No Known  Allergies  Review of Systems  Constitutional:  Negative for chills, diaphoresis, fever and malaise/fatigue.  HENT: Negative.    Eyes: Negative.   Respiratory:  Negative for cough, shortness of breath and wheezing.   Cardiovascular:  Negative for chest pain, leg swelling and PND.  Gastrointestinal:  Negative for abdominal pain, constipation, heartburn, melena, nausea and vomiting.  Musculoskeletal:  Negative for falls, joint pain and myalgias.  Neurological:  Negative for dizziness, seizures, weakness and headaches.  Psychiatric/Behavioral:  Negative for depression and memory loss. The patient is not nervous/anxious.        Objective:   BP 118/86   Pulse 76   Ht 5\' 5"  (1.651 m)   Wt 236 lb 9.6 oz (107.3 kg)   SpO2 97%   BMI 39.37 kg/m   Vitals:   06/09/23 1458  BP: 118/86  Pulse: 76  Height: 5\' 5"  (1.651 m)  Weight: 236 lb 9.6 oz (107.3 kg)  SpO2: 97%  BMI (Calculated): 39.37    Physical Exam Vitals and nursing note reviewed.  Cardiovascular:     Rate and Rhythm: Normal rate and regular rhythm.     Pulses: Normal pulses.     Heart sounds: Normal heart sounds. No murmur heard.  Pulmonary:     Effort: Pulmonary effort is normal.     Breath sounds: Normal breath sounds. No wheezing or rales.  Abdominal:     General: Bowel sounds are normal.     Palpations: Abdomen is soft. There is no mass.     Tenderness: There is no right CVA tenderness, left CVA tenderness, guarding or rebound.  Musculoskeletal:        General: No tenderness or deformity.     Cervical back: Normal range of motion.     Right lower leg: No edema.     Left lower leg: No edema.  Neurological:     Mental Status: She is alert.  Psychiatric:        Mood and Affect: Mood normal.        Behavior: Behavior normal.      No results found for any visits on 06/09/23.  Recent Results (from the past 2160 hour(s))  POCT Urinalysis Dipstick (16109)     Status: Abnormal   Collection Time: 05/01/23 10:56  AM  Result Value Ref Range   Color, UA     Clarity, UA     Glucose, UA Negative Negative   Bilirubin, UA Moderate    Ketones, UA 15mg /dl    Spec Grav, UA 6.045 1.010 - 1.025   Blood, UA Negative    pH, UA 7.0 5.0 - 8.0   Protein, UA Positive (A) Negative    Comment: Trace   Urobilinogen, UA 2.0 (A) 0.2 or 1.0 E.U./dL   Nitrite, UA Negative    Leukocytes, UA Negative Negative   Appearance     Odor    CBC With Differential     Status: None   Collection Time: 05/01/23 11:31 AM  Result Value Ref Range   WBC 7.8 3.4 - 10.8 x10E3/uL   RBC 4.20 3.77 - 5.28 x10E6/uL   Hemoglobin 12.7 11.1 - 15.9 g/dL   Hematocrit 40.9 81.1 - 46.6 %   MCV 93 79 - 97 fL   MCH 30.2 26.6 - 33.0 pg   MCHC 32.6 31.5 - 35.7 g/dL   RDW 91.4 78.2 - 95.6 %   Neutrophils 57 Not Estab. %   Lymphs 32 Not Estab. %   Monocytes 8 Not Estab. %   Eos 2 Not Estab. %   Basos 0 Not Estab. %   Neutrophils Absolute 4.5 1.4 - 7.0 x10E3/uL   Lymphocytes Absolute 2.5 0.7 - 3.1 x10E3/uL   Monocytes Absolute 0.6 0.1 - 0.9 x10E3/uL   EOS (ABSOLUTE) 0.2 0.0 - 0.4 x10E3/uL   Basophils Absolute 0.0 0.0 - 0.2 x10E3/uL   Immature Granulocytes 1 Not Estab. %   Immature Grans (Abs) 0.1 0.0 - 0.1 x10E3/uL  CMP14+EGFR     Status: Abnormal   Collection Time: 05/01/23 11:31 AM  Result Value Ref Range   Glucose 91 70 - 99 mg/dL   BUN 7 6 - 24 mg/dL   Creatinine, Ser 2.13 0.57 - 1.00 mg/dL   eGFR 86 >08 MV/HQI/6.96   BUN/Creatinine Ratio 9 9 - 23   Sodium 141 134 - 144 mmol/L   Potassium 3.9 3.5 - 5.2 mmol/L   Chloride 103 96 - 106 mmol/L   CO2 21 20 - 29 mmol/L   Calcium 9.5 8.7 - 10.2 mg/dL   Total Protein 6.7 6.0 - 8.5 g/dL   Albumin 4.0 3.8 - 4.9 g/dL   Globulin, Total 2.7 1.5 - 4.5 g/dL   Albumin/Globulin Ratio 1.5 1.2 - 2.2  Bilirubin Total 2.7 (H) 0.0 - 1.2 mg/dL   Alkaline Phosphatase 254 (H) 44 - 121 IU/L   AST 435 (H) 0 - 40 IU/L   ALT 692 (HH) 0 - 32 IU/L  Lipase     Status: None   Collection Time: 05/01/23  11:31 AM  Result Value Ref Range   Lipase 20 14 - 72 U/L  Lipid Panel w/o Chol/HDL Ratio     Status: Abnormal   Collection Time: 05/01/23 11:31 AM  Result Value Ref Range   Cholesterol, Total 248 (H) 100 - 199 mg/dL   Triglycerides 76 0 - 149 mg/dL   HDL 57 >16 mg/dL   VLDL Cholesterol Cal 13 5 - 40 mg/dL   LDL Chol Calc (NIH) 109 (H) 0 - 99 mg/dL  Acute Hep Panel & Hep B Surface Ab     Status: None   Collection Time: 05/12/23 11:13 PM  Result Value Ref Range   Hep A IgM Negative Negative   Hepatitis B Surface Ag Negative Negative   Hep B C IgM Negative Negative   Hepatitis B Surf Ab Quant 9,093.0 Immunity>9.9 mIU/mL    Comment: Results confirmed on dilution.   Status of Immunity                     Anti-HBs Level   ------------------                     -------------- Inconsistent with Immunity                   0.0 - 9.9 Consistent with Immunity                          >9.9 **Effective June 23, 2023 the reference interval**   will be changing to: Immunity >10    Hep C Virus Ab Non Reactive Non Reactive    Comment: HCV antibody alone does not differentiate between previously resolved infection and active infection. Equivocal and Reactive HCV antibody results should be followed up with an HCV RNA test to support the diagnosis of active HCV infection.   Liver Profile     Status: Abnormal   Collection Time: 05/12/23 11:13 PM  Result Value Ref Range   Total Protein 7.1 6.0 - 8.5 g/dL   Albumin 4.2 3.8 - 4.9 g/dL   Bilirubin Total 0.3 0.0 - 1.2 mg/dL   Bilirubin, Direct 6.04 0.00 - 0.40 mg/dL   Alkaline Phosphatase 142 (H) 44 - 121 IU/L   AST 23 0 - 40 IU/L   ALT 63 (H) 0 - 32 IU/L      Assessment & Plan:  Surgical evaluation for large gallstone. Start Crestor. Problem List Items Addressed This Visit     Mixed hyperlipidemia   Relevant Medications   rosuvastatin (CRESTOR) 10 MG tablet   Other constipation   Gallstones - Primary   Relevant Orders   Ambulatory  referral to General Surgery    Follow up 3 months.  Total time spent: 25 minutes  Margaretann Loveless, MD  06/09/2023   This document may have been prepared by [facility] Voice Recognition software and as such may include unintentional dictation errors.

## 2023-06-10 ENCOUNTER — Ambulatory Visit: Payer: BC Managed Care – PPO | Admitting: Internal Medicine

## 2023-06-17 ENCOUNTER — Ambulatory Visit: Payer: BC Managed Care – PPO | Admitting: Surgery

## 2023-06-17 ENCOUNTER — Ambulatory Visit: Payer: Self-pay | Admitting: Surgery

## 2023-06-17 ENCOUNTER — Encounter: Payer: Self-pay | Admitting: Surgery

## 2023-06-17 ENCOUNTER — Telehealth: Payer: Self-pay | Admitting: Surgery

## 2023-06-17 ENCOUNTER — Other Ambulatory Visit: Payer: Self-pay | Admitting: Internal Medicine

## 2023-06-17 VITALS — BP 142/89 | HR 66 | Temp 98.0°F | Ht 65.0 in | Wt 236.0 lb

## 2023-06-17 DIAGNOSIS — Z1231 Encounter for screening mammogram for malignant neoplasm of breast: Secondary | ICD-10-CM

## 2023-06-17 DIAGNOSIS — K8010 Calculus of gallbladder with chronic cholecystitis without obstruction: Secondary | ICD-10-CM | POA: Diagnosis not present

## 2023-06-17 NOTE — Progress Notes (Signed)
Patient ID: Samantha Velez, female   DOB: May 03, 1966, 57 y.o.   MRN: [id number]  Chief Complaint: Abdominal pain x 2 months.  History of Present Illness Samantha Velez is a 57 y.o. female with a 53-month history of centralized abdominal pain with associated vomiting.  This appeared to be postprandial.  Patient had severe exacerbated episode of right upper quadrant pain from which gallstones were diagnosed.  She is also gone undergone HIDA scan which showed adequate ejection fraction.  Without remarkable change in her dietary habits, she has had pretty good control without recurring pain recently.  She has had no fevers or chills, no jaundice, no nausea or vomiting.  Past Medical History Past Medical History:  Diagnosis Date   BV (bacterial vaginosis)    Heart murmur    Lipoma, face    Onychomycosis    Postcoital bleeding       Past Surgical History:  Procedure Laterality Date   ABDOMINAL HYSTERECTOMY     [place] bilateral salpingectomy   LIPOMA EXCISION     back    No Known Allergies  Current Outpatient Medications  Medication Sig Dispense Refill   ibuprofen (ADVIL,MOTRIN) 200 MG tablet Take 200 mg by mouth every 6 (six) hours as needed. Headache     Multiple Vitamin (MULTIVITAMIN WITH MINERALS) TABS Take 1 tablet by mouth daily.     rosuvastatin (CRESTOR) 10 MG tablet Take 1 tablet (10 mg total) by mouth daily. 30 tablet 11   No current facility-administered medications for this visit.    Family History Family History  Problem Relation Age of Onset   Hypertension Father    Breast cancer Paternal Aunt    Colon cancer Paternal Grandfather    Ovarian cancer Neg Hx       Social History Social History   Tobacco Use   Smoking status: Never    Passive exposure: Never   Smokeless tobacco: Never  Vaping Use   Vaping Use: Never used  Substance Use Topics   Alcohol use: No   Drug use: No        Review of Systems  Constitutional: Negative.   HENT: Negative.    Eyes:  Negative.   Respiratory: Negative.    Cardiovascular: Negative.   Gastrointestinal: Negative.   Genitourinary: Negative.   Skin: Negative.   Neurological: Negative.   Psychiatric/Behavioral: Negative.       Physical Exam Blood pressure (!) 142/89, pulse 66, temperature 98 F (36.7 C), height 5\' 5"  (1.651 m), weight 236 lb (107 kg), SpO2 98 %. Last Weight  Most recent update: 06/17/2023 10:08 AM    Weight  107 kg (236 lb)             CONSTITUTIONAL: Well developed, and nourished, appropriately responsive and aware without distress.   EYES: Sclera non-icteric.   EARS, NOSE, MOUTH AND THROAT:  The oropharynx is clear. Oral mucosa is pink and moist.   Hearing is intact to voice.  NECK: Trachea is midline, and there is no jugular venous distension.  LYMPH NODES:  Lymph nodes in the neck are not appreciated. RESPIRATORY:  Lungs are clear, and breath sounds are equal bilaterally.  Normal respiratory effort without pathologic use of accessory muscles. CARDIOVASCULAR: Heart is regular in rate and rhythm.   Well perfused.  GI: The abdomen is  soft, nontender, and nondistended. There were no palpable masses.  I did not appreciate hepatosplenomegaly. MUSCULOSKELETAL:  Symmetrical muscle tone appreciated in all four extremities.    SKIN:  Skin turgor is normal. No pathologic skin lesions appreciated.  NEUROLOGIC:  Motor and sensation appear grossly normal.  Cranial nerves are grossly without defect. PSYCH:  Alert and oriented to person, place and time. Affect is appropriate for situation.  Data Reviewed I have personally reviewed what is currently available of the patient's imaging, recent labs and medical records.   Labs:     Latest Ref Rng & Units 05/01/2023   11:31 AM 10/26/2013    6:46 AM 10/20/2013   11:15 AM  CBC  WBC 3.4 - 10.8 x10E3/uL 7.8   8.7   Hemoglobin 11.1 - 15.9 g/dL 16.1  09.6  04.5   Hematocrit 34.0 - 46.6 % 38.9   39.4   Platelets 150 - 440 x10 3/mm 3   226        Latest Ref Rng & Units 05/12/2023   11:13 PM 05/01/2023   11:31 AM 10/26/2013    6:46 AM  CMP  Glucose 70 - 99 mg/dL  91    BUN 6 - 24 mg/dL  7    Creatinine 4.09 - 1.00 mg/dL  8.11  9.14   Sodium 782 - 144 mmol/L  141    Potassium 3.5 - 5.2 mmol/L  3.9    Chloride 96 - 106 mmol/L  103    CO2 20 - 29 mmol/L  21    Calcium 8.7 - 10.2 mg/dL  9.5    Total Protein 6.0 - 8.5 g/dL 7.1  6.7    Total Bilirubin 0.0 - 1.2 mg/dL 0.3  2.7    Alkaline Phos 44 - 121 IU/L 142  254    AST 0 - 40 IU/L 23  435    ALT 0 - 32 IU/L 63  692      Imaging: Radiological images reviewed:  CLINICAL DATA:  Right upper quadrant   EXAM: ULTRASOUND ABDOMEN LIMITED RIGHT UPPER QUADRANT   COMPARISON:  None Available.   FINDINGS: Gallbladder:   Cholelithiasis. Gallbladder minimally thickened. No pericholecystic fluid. Negative sonographic Murphy's sign.   Common bile duct:   Diameter: 5.1 mm   Liver:   Increased echogenicity. There is a 2.5 cm cyst within the right hepatic lobe. No suspicious lesion. Portal vein is patent on color Doppler imaging with normal direction of blood flow towards the liver.   Other: None.   IMPRESSION: 1. Cholelithiasis without secondary signs of acute cholecystitis. 2. Increased hepatic parenchymal echogenicity suggestive of steatosis.     Electronically Signed   By: Annia Belt M.D.   On: 05/05/2023 11:44 Within last 24 hrs: No results found.  Assessment    Chronic calculus cholecystitis with biliary colic  Patient Active Problem List   Diagnosis Date Noted   Gallstones 06/09/2023   Abnormal liver enzymes 05/12/2023   Generalized abdominal pain 05/01/2023   Mixed hyperlipidemia 05/01/2023   Prehypertension 05/01/2023   Obesity (BMI 30-39.9) 05/01/2023   Other constipation 05/01/2023   Acute bacterial conjunctivitis of both eyes 04/09/2023    Plan    This was discussed thoroughly.  Optimal plan is for robotic cholecystectomy utilizing ICG  imaging. Risks and benefits have been discussed with the patient which include but are not limited to anesthesia, bleeding, infection, biliary ductal injury, resulting in leak or stenosis, other associated unanticipated injuries affiliated with laparoscopic surgery.   Reviewed that removing the gallbladder will only address the symptoms related to the gallbladder itself.  I believe there is the desire to proceed, accepting the risks with understanding.  Questions elicited and answered to satisfaction.   No guarantees ever expressed or implied.   Face-to-face time spent with the patient and accompanying care providers(if present) was 30 minutes, with more than 50% of the time spent counseling, educating, and coordinating care of the patient.    These notes generated with voice recognition software. I apologize for typographical errors.  Campbell Lerner M.D., FACS 06/17/2023, 10:59 AM

## 2023-06-17 NOTE — Patient Instructions (Addendum)
You have requested to have your gallbladder removed. This will be done at [facility] with Dr. Claudine Mouton. We are looking at doing this the last week of July.   You will most likely be out of work 1-2 weeks for this surgery.  If you have FMLA or disability paperwork that needs filled out you may drop this off at our office or this can be faxed to [phone].  You will return after your post-op appointment with a lifting restriction for approximately 4 more weeks.  You will be able to eat anything you would like to following surgery. But, start by eating a bland diet and advance this as tolerated. The Gallbladder diet is below, please go as closely by this diet as possible prior to surgery to avoid any further attacks.  Please see the (blue)pre-care form that you have been given today. Our surgery scheduler will call you to verify surgery date and to go over information.   If you have any questions, please call our office.  Laparoscopic Cholecystectomy Laparoscopic cholecystectomy is surgery to remove the gallbladder. The gallbladder is located in the upper right part of the abdomen, behind the liver. It is a storage sac for bile, which is produced in the liver. Bile aids in the digestion and absorption of fats. Cholecystectomy is often done for inflammation of the gallbladder (cholecystitis). This condition is usually caused by a buildup of gallstones (cholelithiasis) in the gallbladder. Gallstones can block the flow of bile, and that can result in inflammation and pain. In severe cases, emergency surgery may be required. If emergency surgery is not required, you will have time to prepare for the procedure. Laparoscopic surgery is an alternative to open surgery. Laparoscopic surgery has a shorter recovery time. Your common bile duct may also need to be examined during the procedure. If stones are found in the common bile duct, they may be removed. LET [facility] CARE PROVIDER KNOW  ABOUT: Any allergies you have. All medicines you are taking, including vitamins, herbs, eye drops, creams, and over-the-counter medicines. Previous problems you or members of your family have had with the use of anesthetics. Any blood disorders you have. Previous surgeries you have had.  Any medical conditions you have. RISKS AND COMPLICATIONS Generally, this is a safe procedure. However, problems may occur, including: Infection. Bleeding. Allergic reactions to medicines. Damage to other structures or organs. A stone remaining in the common bile duct. A bile leak from the cyst duct that is clipped when your gallbladder is removed. The need to convert to open surgery, which requires a larger incision in the abdomen. This may be necessary if your surgeon thinks that it is not safe to continue with a laparoscopic procedure. BEFORE THE PROCEDURE Ask your health care provider about: Changing or stopping your regular medicines. This is especially important if you are taking diabetes medicines or blood thinners. Taking medicines such as aspirin and ibuprofen. These medicines can thin your blood. Do not take these medicines before your procedure if your health care provider instructs you not to. Follow instructions from your health care provider about eating or drinking restrictions. Let your health care provider know if you develop a cold or an infection before surgery. Plan to have someone take you home after the procedure. Ask your health care provider how your surgical site will be marked or identified. You may be given antibiotic medicine to help prevent infection. PROCEDURE To reduce your risk of infection: Your health care team will wash or  sanitize their hands. Your skin will be washed with soap. An IV tube may be inserted into one of your veins. You will be given a medicine to make you fall asleep (general anesthetic). A breathing tube will be placed in your mouth. The surgeon will  make several small cuts (incisions) in your abdomen. A thin, lighted tube (laparoscope) that has a tiny camera on the end will be inserted through one of the small incisions. The camera on the laparoscope will send a picture to a TV screen (monitor) in the operating room. This will give the surgeon a good view inside your abdomen. A gas will be pumped into your abdomen. This will expand your abdomen to give the surgeon more room to perform the surgery. Other tools that are needed for the procedure will be inserted through the other incisions. The gallbladder will be removed through one of the incisions. After your gallbladder has been removed, the incisions will be closed with stitches (sutures), staples, or skin glue. Your incisions may be covered with a bandage (dressing). The procedure may vary among health care providers and hospitals. AFTER THE PROCEDURE Your blood pressure, heart rate, breathing rate, and blood oxygen level will be monitored often until the medicines you were given have worn off. You will be given medicines as needed to control your pain.   This information is not intended to replace advice given to you by your health care provider. Make sure you discuss any questions you have with your health care provider.   Document Released: 12/09/2005 Document Revised: 08/30/2015 Document Reviewed: 07/21/2013 Elsevier Interactive Patient Education 2016 [place] Diet for Gallbladder Conditions A low-fat diet can be helpful if you have pancreatitis or a gallbladder condition. With these conditions, your pancreas and gallbladder have trouble digesting fats. A healthy eating plan with less fat will help rest your pancreas and gallbladder and reduce your symptoms. WHAT DO I NEED TO KNOW ABOUT THIS DIET? Eat a low-fat diet. Reduce your fat intake to less than 20-30% of your total daily calories. This is less than 50-60 g of fat per day. Remember that you need some fat in  your diet. Ask your dietician what your daily goal should be. Choose nonfat and low-fat healthy foods. Look for the words "nonfat," "low fat," or "fat free." As a guide, look on the label and choose foods with less than 3 g of fat per serving. Eat only one serving. Avoid alcohol. Do not smoke. If you need help quitting, talk with your health care provider. Eat small frequent meals instead of three large heavy meals. WHAT FOODS CAN I EAT? Grains Include healthy grains and starches such as potatoes, wheat bread, fiber-rich cereal, and brown rice. Choose whole grain options whenever possible. In adults, whole grains should account for 45-65% of your daily calories.  Fruits and Vegetables Eat plenty of fruits and vegetables. Fresh fruits and vegetables add fiber to your diet. Meats and Other Protein Sources Eat lean meat such as chicken and pork. Trim any fat off of meat before cooking it. Eggs, fish, and beans are other sources of protein. In adults, these foods should account for 10-35% of your daily calories. Dairy Choose low-fat milk and dairy options. Dairy includes fat and protein, as well as calcium.  Fats and Oils Limit high-fat foods such as fried foods, sweets, baked goods, sugary drinks.  Other Creamy sauces and condiments, such as mayonnaise, can add extra fat. Think about whether or not you  need to use them, or use smaller amounts or low fat options. WHAT FOODS ARE NOT RECOMMENDED? High fat foods, such as: [employer]. Ice cream. [place] toast. Sweet rolls. Pizza. Cheese bread. Foods covered with batter, butter, creamy sauces, or cheese. Fried foods. Sugary drinks and desserts. Foods that cause gas or bloating   This information is not intended to replace advice given to you by your health care provider. Make sure you discuss any questions you have with your health care provider.   Document Released: 12/14/2013 Document Reviewed: 12/14/2013 Elsevier Interactive Patient  Education [employer]

## 2023-06-17 NOTE — Telephone Encounter (Signed)
Left message for patient to call, please inform her of the following regarding scheduled surgery with Dr. Claudine Mouton.   Pre-Admission date/time, and Surgery date at [facility].  Surgery Date: 07/23/23 Preadmission Testing Date: 07/14/23 (phone 1p-4p)  Also patient will need to call at [phone], between 1-3:00pm the day before surgery, to find out what time to arrive for surgery.

## 2023-06-19 NOTE — Telephone Encounter (Signed)
Patient calls back, she is informed of all dates regarding surgery.   

## 2023-06-19 NOTE — Telephone Encounter (Signed)
Left another message for patient to call.

## 2023-06-24 ENCOUNTER — Ambulatory Visit
Admission: RE | Admit: 2023-06-24 | Discharge: 2023-06-24 | Disposition: A | Discharge status: Home / Self Care | Payer: BC Managed Care – PPO | Source: Ambulatory Visit | Attending: Internal Medicine | Admitting: Internal Medicine

## 2023-06-24 DIAGNOSIS — Z1231 Encounter for screening mammogram for malignant neoplasm of breast: Secondary | ICD-10-CM | POA: Diagnosis present

## 2023-07-07 ENCOUNTER — Telehealth: Payer: Self-pay | Admitting: *Deleted

## 2023-07-07 NOTE — Telephone Encounter (Signed)
Note written and patient has been notified she may pick this up at the front desk.

## 2023-07-07 NOTE — Telephone Encounter (Signed)
Patient called and stated that she would like to pick up a work note about being out of worl for 2 weeks due to surgery. She is having surgery on 07/23/23 Dr Claudine Mouton gallbladder

## 2023-07-14 ENCOUNTER — Encounter
Admission: RE | Admit: 2023-07-14 | Discharge: 2023-07-14 | Disposition: A | Discharge status: Home / Self Care | Payer: BC Managed Care – PPO | Source: Ambulatory Visit | Attending: Surgery | Admitting: Surgery

## 2023-07-14 ENCOUNTER — Other Ambulatory Visit: Payer: Self-pay

## 2023-07-14 VITALS — Ht 65.0 in | Wt 240.0 lb

## 2023-07-14 DIAGNOSIS — R030 Elevated blood-pressure reading, without diagnosis of hypertension: Secondary | ICD-10-CM

## 2023-07-14 DIAGNOSIS — E782 Mixed hyperlipidemia: Secondary | ICD-10-CM

## 2023-07-14 DIAGNOSIS — E669 Obesity, unspecified: Secondary | ICD-10-CM

## 2023-07-14 HISTORY — DX: Mixed hyperlipidemia: E78.2

## 2023-07-14 HISTORY — DX: Fatty (change of) liver, not elsewhere classified: K76.0

## 2023-07-14 HISTORY — DX: COVID-19: U07.1

## 2023-07-14 NOTE — Patient Instructions (Addendum)
Your procedure is scheduled on: Wednesday 07/23/23 To find out your arrival time, please call [phone] between 1PM - 3PM on:  Tuesday 07/22/23  Report to the Registration Desk on the 1st floor of the [facility]. Free Valet parking is available.  If your arrival time is 6:00 am, do not arrive before that time as the [facility] entrance doors do not open until 6:00 am.  REMEMBER: Instructions that are not followed completely may result in serious medical risk, up to and including death; or upon the discretion of your surgeon and anesthesiologist your surgery may need to be rescheduled.  Do not eat food or drink any liquids after midnight the night before surgery.  No gum chewing or hard candies.  One week prior to surgery: Stop Anti-inflammatories (NSAIDS) such as Advil, Aleve, Ibuprofen, Motrin, Naproxen, Naprosyn and Aspirin based products such as Excedrin, Goody's Powder, BC Powder. You may however, continue to take Tylenol if needed for pain up until the day of surgery.  Stop ANY OVER THE COUNTER supplements or vitamins until after surgery.  Continue taking all prescribed medications.  TAKE ONLY THESE MEDICATIONS THE MORNING OF SURGERY WITH A SIP OF WATER:  rosuvastatin (CRESTOR) 10 MG tablet   No Alcohol for 24 hours before or after surgery.  No Smoking including e-cigarettes for 24 hours before surgery.  No chewable tobacco products for at least 6 hours before surgery.  No nicotine patches on the day of surgery.  Do not use any "recreational" drugs for at least a week (preferably 2 weeks) before your surgery.  Please be advised that the combination of cocaine and anesthesia may have negative outcomes, up to and including death. If you test positive for cocaine, your surgery will be cancelled.  On the morning of surgery brush your teeth with toothpaste and water, you may rinse your mouth with mouthwash if you wish. Do not swallow any toothpaste or mouthwash.  Use CHG  Soap or wipes as directed on instruction sheet.  Do not wear lotions, powders, or perfumes.   Do not shave body hair from the neck down 48 hours before surgery.  Wear comfortable clothing (specific to your surgery type) to the hospital.  Do not wear jewelry, make-up, hairpins, clips or nail polish.  Contact lenses, hearing aids and dentures may not be worn into surgery.  Do not bring valuables to the hospital. [facility] is not responsible for any missing/lost belongings or valuables.   Notify your doctor if there is any change in your medical condition (cold, fever, infection).  If you are being discharged the day of surgery, you will not be allowed to drive home. You will need a responsible individual to drive you home and stay with you for 24 hours after surgery.   If you are taking public transportation, you will need to have a responsible individual with you.  If you are being admitted to the hospital overnight, leave your suitcase in the car. After surgery it may be brought to your room.  In case of increased patient census, it may be necessary for you, the patient, to continue your postoperative care in the Same Day Surgery department.  After surgery, you can help prevent lung complications by doing breathing exercises.  Take deep breaths and cough every 1-2 hours. Your doctor may order a device called an Incentive Spirometer to help you take deep breaths. When coughing or sneezing, hold a pillow firmly against your incision with both hands. This is called "splinting."  Doing this helps protect your incision. It also decreases belly discomfort.  Surgery Visitation Policy:  Patients undergoing a surgery or procedure may have two family members or support persons with them as long as the person is not COVID-19 positive or experiencing its symptoms.   Inpatient Visitation:    Visiting hours are 7 a.m. to 8 p.m. Up to four visitors are allowed at one time in a patient room. The  visitors may rotate out with other people during the day. One designated support person (adult) may remain overnight.  Please call the Pre-admissions Testing Dept. at [phone] if you have any questions about these instructions.     Preparing for Surgery with CHLORHEXIDINE GLUCONATE (CHG) Soap  Chlorhexidine Gluconate (CHG) Soap  o An antiseptic cleaner that kills germs and bonds with the skin to continue killing germs even after washing  o Used for showering the night before surgery and morning of surgery  Before surgery, you can play an important role by reducing the number of germs on your skin.  CHG (Chlorhexidine gluconate) soap is an antiseptic cleanser which kills germs and bonds with the skin to continue killing germs even after washing.  Please do not use if you have an allergy to CHG or antibacterial soaps. If your skin becomes reddened/irritated stop using the CHG.  1. Shower the NIGHT BEFORE SURGERY and the MORNING OF SURGERY with CHG soap.  2. If you choose to wash your hair, wash your hair first as usual with your normal shampoo.  3. After shampooing, rinse your hair and body thoroughly to remove the shampoo.  4. Use CHG as you would any other liquid soap. You can apply CHG directly to the skin and wash gently with a scrungie or a clean washcloth.  5. Apply the CHG soap to your body only from the neck down. Do not use on open wounds or open sores. Avoid contact with your eyes, ears, mouth, and genitals (private parts). Wash face and genitals (private parts) with your normal soap.  6. Wash thoroughly, paying special attention to the area where your surgery will be performed.  7. Thoroughly rinse your body with warm water.  8. Do not shower/wash with your normal soap after using and rinsing off the CHG soap.  9. Pat yourself dry with a clean towel.  10. Wear clean pajamas to bed the night before surgery.  12. Place clean sheets on your bed the night of your  first shower and do not sleep with pets.  13. Shower again with the CHG soap on the day of surgery prior to arriving at the hospital.  14. Do not apply any deodorants/lotions/powders.  15. Please wear clean clothes to the hospital.

## 2023-07-16 ENCOUNTER — Encounter
Admission: RE | Admit: 2023-07-16 | Discharge: 2023-07-16 | Disposition: A | Discharge status: Home / Self Care | Payer: BC Managed Care – PPO | Source: Ambulatory Visit | Attending: Surgery | Admitting: Surgery

## 2023-07-16 DIAGNOSIS — E669 Obesity, unspecified: Secondary | ICD-10-CM

## 2023-07-16 DIAGNOSIS — Z01810 Encounter for preprocedural cardiovascular examination: Secondary | ICD-10-CM | POA: Diagnosis not present

## 2023-07-16 DIAGNOSIS — R030 Elevated blood-pressure reading, without diagnosis of hypertension: Secondary | ICD-10-CM

## 2023-07-16 DIAGNOSIS — E782 Mixed hyperlipidemia: Secondary | ICD-10-CM | POA: Diagnosis not present

## 2023-07-22 MED ORDER — GABAPENTIN 300 MG PO CAPS
300.0000 mg | ORAL_CAPSULE | ORAL | Status: AC
  Administered 2023-07-23: 300 mg via ORAL

## 2023-07-22 MED ORDER — FAMOTIDINE 20 MG PO TABS
20.0000 mg | ORAL_TABLET | Freq: Once | ORAL | Status: AC
  Administered 2023-07-23: 20 mg via ORAL

## 2023-07-22 MED ORDER — BUPIVACAINE LIPOSOME 1.3 % IJ SUSP: 20.0000 mL | Freq: Once | INTRAMUSCULAR | Status: DC

## 2023-07-22 MED ORDER — INDOCYANINE GREEN 25 MG IV SOLR
1.2500 mg | Freq: Once | INTRAVENOUS | Status: AC
  Administered 2023-07-23: 1.25 mg via INTRAVENOUS

## 2023-07-22 MED ORDER — CELECOXIB 200 MG PO CAPS
200.0000 mg | ORAL_CAPSULE | ORAL | Status: AC
  Administered 2023-07-23: 200 mg via ORAL

## 2023-07-22 MED ORDER — CHLORHEXIDINE GLUCONATE CLOTH 2 % EX PADS
6.0000 | MEDICATED_PAD | Freq: Once | CUTANEOUS | Status: AC
  Administered 2023-07-23: 6 via TOPICAL

## 2023-07-22 MED ORDER — ACETAMINOPHEN 500 MG PO TABS
1000.0000 mg | ORAL_TABLET | ORAL | Status: AC
  Administered 2023-07-23: 1000 mg via ORAL

## 2023-07-22 MED ORDER — CEFAZOLIN SODIUM-DEXTROSE 2-4 GM/100ML-% IV SOLN
2.0000 g | INTRAVENOUS | Status: AC
  Administered 2023-07-23: 2 g via INTRAVENOUS

## 2023-07-22 MED ORDER — LACTATED RINGERS IV SOLN: INTRAVENOUS | Status: DC

## 2023-07-22 MED ORDER — ORAL CARE MOUTH RINSE: 15.0000 mL | Freq: Once | OROMUCOSAL | Status: AC

## 2023-07-22 MED ORDER — CHLORHEXIDINE GLUCONATE 0.12 % MT SOLN
15.0000 mL | Freq: Once | OROMUCOSAL | Status: AC
  Administered 2023-07-23: 15 mL via OROMUCOSAL

## 2023-07-23 ENCOUNTER — Other Ambulatory Visit: Payer: Self-pay

## 2023-07-23 ENCOUNTER — Ambulatory Visit: Payer: BC Managed Care – PPO | Admitting: Urgent Care

## 2023-07-23 ENCOUNTER — Encounter
Admission: RE | Disposition: A | Discharge status: Home / Self Care | Payer: Self-pay | Source: Home / Self Care | Attending: Surgery

## 2023-07-23 ENCOUNTER — Ambulatory Visit: Payer: BC Managed Care – PPO | Admitting: Registered Nurse

## 2023-07-23 ENCOUNTER — Ambulatory Visit
Admission: RE | Admit: 2023-07-23 | Discharge: 2023-07-23 | Disposition: A | Discharge status: Home / Self Care | Payer: BC Managed Care – PPO | Attending: Surgery | Admitting: Surgery

## 2023-07-23 ENCOUNTER — Encounter: Payer: Self-pay | Admitting: Surgery

## 2023-07-23 DIAGNOSIS — K8010 Calculus of gallbladder with chronic cholecystitis without obstruction: Secondary | ICD-10-CM | POA: Diagnosis present

## 2023-07-23 SURGERY — CHOLECYSTECTOMY, ROBOT-ASSISTED, LAPAROSCOPIC
Anesthesia: General | Site: Abdomen

## 2023-07-23 MED ORDER — DEXMEDETOMIDINE HCL IN NACL 80 MCG/20ML IV SOLN
INTRAVENOUS | Status: DC | PRN
  Administered 2023-07-23: 4 ug via INTRAVENOUS
  Administered 2023-07-23 (×2): 8 ug via INTRAVENOUS
  Administered 2023-07-23: 4 ug via INTRAVENOUS

## 2023-07-23 MED ORDER — FENTANYL CITRATE (PF) 100 MCG/2ML IJ SOLN
INTRAMUSCULAR | Status: DC | PRN
  Administered 2023-07-23 (×2): 50 ug via INTRAVENOUS

## 2023-07-23 MED ORDER — 0.9 % SODIUM CHLORIDE (POUR BTL) OPTIME
TOPICAL | Status: DC | PRN
  Administered 2023-07-23: 500 mL

## 2023-07-23 MED ORDER — BUPIVACAINE-EPINEPHRINE (PF) 0.25% -1:200000 IJ SOLN
INTRAMUSCULAR | Status: AC
  Filled 2023-07-23: qty 30

## 2023-07-23 MED ORDER — METOPROLOL TARTRATE 5 MG/5ML IV SOLN
INTRAVENOUS | Status: DC | PRN
Start: 2023-07-23 — End: 2023-07-23
  Administered 2023-07-23: 2.5 mg via INTRAVENOUS

## 2023-07-23 MED ORDER — LIDOCAINE HCL (CARDIAC) PF 100 MG/5ML IV SOSY
PREFILLED_SYRINGE | INTRAVENOUS | Status: DC | PRN
  Administered 2023-07-23: 50 mg via INTRAVENOUS

## 2023-07-23 MED ORDER — ACETAMINOPHEN 500 MG PO TABS
ORAL_TABLET | ORAL | Status: AC
  Filled 2023-07-23: qty 2

## 2023-07-23 MED ORDER — GABAPENTIN 300 MG PO CAPS
ORAL_CAPSULE | ORAL | Status: AC
  Filled 2023-07-23: qty 1

## 2023-07-23 MED ORDER — LACTATED RINGERS IV SOLN: INTRAVENOUS | Status: DC | PRN

## 2023-07-23 MED ORDER — FENTANYL CITRATE (PF) 100 MCG/2ML IJ SOLN: 25.0000 ug | INTRAMUSCULAR | Status: DC | PRN

## 2023-07-23 MED ORDER — ROCURONIUM BROMIDE 100 MG/10ML IV SOLN
INTRAVENOUS | Status: DC | PRN
  Administered 2023-07-23: 60 mg via INTRAVENOUS

## 2023-07-23 MED ORDER — BUPIVACAINE-EPINEPHRINE (PF) 0.5% -1:200000 IJ SOLN
INTRAMUSCULAR | Status: DC | PRN
  Administered 2023-07-23: 50 mL

## 2023-07-23 MED ORDER — FAMOTIDINE 20 MG PO TABS
ORAL_TABLET | ORAL | Status: AC
  Filled 2023-07-23: qty 1

## 2023-07-23 MED ORDER — DEXAMETHASONE SODIUM PHOSPHATE 10 MG/ML IJ SOLN
INTRAMUSCULAR | Status: DC | PRN
  Administered 2023-07-23: 10 mg via INTRAVENOUS

## 2023-07-23 MED ORDER — CELECOXIB 200 MG PO CAPS
ORAL_CAPSULE | ORAL | Status: AC
  Filled 2023-07-23: qty 1

## 2023-07-23 MED ORDER — CEFAZOLIN SODIUM-DEXTROSE 2-4 GM/100ML-% IV SOLN
INTRAVENOUS | Status: AC
  Filled 2023-07-23: qty 100

## 2023-07-23 MED ORDER — MIDAZOLAM HCL 2 MG/2ML IJ SOLN
INTRAMUSCULAR | Status: AC
  Filled 2023-07-23: qty 2

## 2023-07-23 MED ORDER — CHLORHEXIDINE GLUCONATE 0.12 % MT SOLN
OROMUCOSAL | Status: AC
  Filled 2023-07-23: qty 15

## 2023-07-23 MED ORDER — OXYCODONE HCL 5 MG/5ML PO SOLN: 5.0000 mg | Freq: Once | ORAL | Status: DC | PRN

## 2023-07-23 MED ORDER — FENTANYL CITRATE (PF) 100 MCG/2ML IJ SOLN
INTRAMUSCULAR | Status: AC
  Filled 2023-07-23: qty 2

## 2023-07-23 MED ORDER — SUGAMMADEX SODIUM 200 MG/2ML IV SOLN
INTRAVENOUS | Status: DC | PRN
  Administered 2023-07-23: 100 mg via INTRAVENOUS
  Administered 2023-07-23: 300 mg via INTRAVENOUS

## 2023-07-23 MED ORDER — INDOCYANINE GREEN 25 MG IV SOLR
INTRAVENOUS | Status: AC
  Filled 2023-07-23: qty 10

## 2023-07-23 MED ORDER — MIDAZOLAM HCL 2 MG/2ML IJ SOLN
INTRAMUSCULAR | Status: DC | PRN
  Administered 2023-07-23: 2 mg via INTRAVENOUS

## 2023-07-23 MED ORDER — ONDANSETRON HCL 4 MG/2ML IJ SOLN
INTRAMUSCULAR | Status: DC | PRN
  Administered 2023-07-23: 4 mg via INTRAVENOUS

## 2023-07-23 MED ORDER — PROPOFOL 10 MG/ML IV BOLUS
INTRAVENOUS | Status: DC | PRN
Start: 2023-07-23 — End: 2023-07-23
  Administered 2023-07-23: 50 mg via INTRAVENOUS
  Administered 2023-07-23: 150 mg via INTRAVENOUS

## 2023-07-23 MED ORDER — PROPOFOL 10 MG/ML IV BOLUS
INTRAVENOUS | Status: AC
  Filled 2023-07-23: qty 20

## 2023-07-23 MED ORDER — BUPIVACAINE LIPOSOME 1.3 % IJ SUSP
INTRAMUSCULAR | Status: AC
  Filled 2023-07-23: qty 20

## 2023-07-23 MED ORDER — HYDROCODONE-ACETAMINOPHEN 5-325 MG PO TABS: 1.0000 | ORAL_TABLET | Freq: Four times a day (QID) | ORAL | 0 refills | Status: DC | PRN

## 2023-07-23 MED ORDER — ROCURONIUM BROMIDE 10 MG/ML (PF) SYRINGE
PREFILLED_SYRINGE | INTRAVENOUS | Status: AC
  Filled 2023-07-23: qty 10

## 2023-07-23 MED ORDER — OXYCODONE HCL 5 MG PO TABS: 5.0000 mg | ORAL_TABLET | Freq: Once | ORAL | Status: DC | PRN

## 2023-07-23 SURGICAL SUPPLY — 47 items
ADH SKN CLS APL DERMABOND .7 (GAUZE/BANDAGES/DRESSINGS) ×2
BAG PRESSURE INF REUSE 3000 (BAG) IMPLANT
CLIP LIGATING HEM O LOK PURPLE (MISCELLANEOUS) ×2 IMPLANT
COVER TIP SHEARS 8 DVNC (MISCELLANEOUS) ×2 IMPLANT
DERMABOND ADVANCED .7 DNX12 (GAUZE/BANDAGES/DRESSINGS) ×2 IMPLANT
DRAPE ARM DVNC X/XI (DISPOSABLE) ×8 IMPLANT
DRAPE COLUMN DVNC XI (DISPOSABLE) ×2 IMPLANT
ELECT CAUTERY BLADE 6.4 (BLADE) ×2 IMPLANT
FORCEPS BPLR R/ABLATION 8 DVNC (INSTRUMENTS) ×2 IMPLANT
FORCEPS PROGRASP DVNC XI (FORCEP) ×2 IMPLANT
GLOVE ORTHO TXT STRL SZ7.5 (GLOVE) ×4 IMPLANT
GOWN STRL REUS W/ TWL LRG LVL3 (GOWN DISPOSABLE) ×4 IMPLANT
GOWN STRL REUS W/ TWL XL LVL3 (GOWN DISPOSABLE) ×4 IMPLANT
GOWN STRL REUS W/TWL LRG LVL3 (GOWN DISPOSABLE) ×4
GOWN STRL REUS W/TWL XL LVL3 (GOWN DISPOSABLE) ×4
GRASPER SUT TROCAR 14GX15 (MISCELLANEOUS) ×2 IMPLANT
IRRIGATION STRYKERFLOW (MISCELLANEOUS) IMPLANT
IRRIGATOR STRYKERFLOW (MISCELLANEOUS)
IRRIGATOR SUCT 8 DISP DVNC XI (IRRIGATION / IRRIGATOR) IMPLANT
IV NS IRRIG 3000ML ARTHROMATIC (IV SOLUTION) IMPLANT
KIT PINK PAD W/HEAD ARE REST (MISCELLANEOUS) ×2
KIT PINK PAD W/HEAD ARM REST (MISCELLANEOUS) ×2 IMPLANT
KIT TURNOVER KIT A (KITS) ×2 IMPLANT
LABEL OR SOLS (LABEL) ×2 IMPLANT
MANIFOLD NEPTUNE II (INSTRUMENTS) ×2 IMPLANT
NDL HYPO 22X1.5 SAFETY MO (MISCELLANEOUS) ×2 IMPLANT
NDL INSUFFLATION 14GA 120MM (NEEDLE) ×2 IMPLANT
NEEDLE HYPO 22X1.5 SAFETY MO (MISCELLANEOUS) ×2
NEEDLE INSUFFLATION 14GA 120MM (NEEDLE) ×2
NS IRRIG 500ML POUR BTL (IV SOLUTION) ×2 IMPLANT
PACK LAP CHOLECYSTECTOMY (MISCELLANEOUS) ×2 IMPLANT
SCISSORS METZENBAUM CVD 33 (INSTRUMENTS) IMPLANT
SCISSORS MNPLR CVD DVNC XI (INSTRUMENTS) ×2 IMPLANT
SEAL UNIV 5-12 XI (MISCELLANEOUS) ×8 IMPLANT
SET TUBE SMOKE EVAC HIGH FLOW (TUBING) ×2 IMPLANT
SOL ELECTROSURG ANTI STICK (MISCELLANEOUS) ×2
SOLUTION ELECTROSURG ANTI STCK (MISCELLANEOUS) ×2 IMPLANT
SPIKE FLUID TRANSFER (MISCELLANEOUS) ×2 IMPLANT
SUT MNCRL 4-0 (SUTURE) ×4
SUT MNCRL 4-0 27XMFL (SUTURE) ×4
SUT VICRYL 0 UR6 27IN ABS (SUTURE) ×2 IMPLANT
SUTURE MNCRL 4-0 27XMF (SUTURE) ×2 IMPLANT
SYS BAG RETRIEVAL 10MM (BASKET) ×2
SYSTEM BAG RETRIEVAL 10MM (BASKET) ×2 IMPLANT
TRAP FLUID SMOKE EVACUATOR (MISCELLANEOUS) ×2 IMPLANT
TROCAR Z-THREAD FIOS 11X100 BL (TROCAR) ×2 IMPLANT
WATER STERILE IRR 500ML POUR (IV SOLUTION) ×2 IMPLANT

## 2023-07-23 NOTE — Interval H&P Note (Signed)
History and Physical Interval Note:  07/23/2023 6:52 AM  Samantha Velez  has presented today for surgery, with the diagnosis of chronic calculous cholecystitis.  The various methods of treatment have been discussed with the patient and family. After consideration of risks, benefits and other options for treatment, the patient has consented to  Procedure(s): XI ROBOTIC ASSISTED LAPAROSCOPIC CHOLECYSTECTOMY (N/A) INDOCYANINE GREEN FLUORESCENCE IMAGING (ICG) (N/A) as a surgical intervention.  The patient's history has been reviewed, patient examined, no change in status, stable for surgery.  I have reviewed the patient's chart and labs.  Questions were answered to the patient's satisfaction.     Samantha Velez

## 2023-07-23 NOTE — Op Note (Signed)
Robotic cholecystectomy with Indocyamine Green Ductal Imaging.   Pre-operative Diagnosis: Chronic calculus cholecystitis  Post-operative Diagnosis:  Same.  Procedure: Robotic assisted laparoscopic cholecystectomy with Indocyamine Green Ductal Imaging.   Surgeon: Campbell Lerner, M.D., FACS  Anesthesia: General. with endotracheal tube  Findings: various sizes of smooth, yet lobulated stones.   Estimated Blood Loss: 15 mL         Drains: None         Specimens: Gallbladder           Complications: none  Procedure Details  The patient was seen again in the Holding Room.  1.25 mg dose of ICG was administered intravenously.   The benefits, complications, treatment options, risks and expected outcomes were again reviewed with the patient. The likelihood of improving the patient's symptoms with return to their baseline status is good.  The patient and/or family concurred with the proposed plan, giving informed consent, again alternatives reviewed.  The patient was taken to Operating Room, identified, and the procedure verified as robotic assisted laparoscopic cholecystectomy.  Prior to the induction of general anesthesia, antibiotic prophylaxis was administered. VTE prophylaxis was in place. General endotracheal anesthesia was then administered and tolerated well. The patient was positioned in the supine position.  After the induction, the abdomen was prepped with Chloraprep and draped in the sterile fashion.  A Time Out was held and the above information confirmed.  After local infiltration of quarter percent Marcaine with epinephrine, stab incision was made left upper quadrant.  Just below the costal margin at Palmer's point, approximately midclavicular line the Veres needle is passed with sensation of the layers to penetrate the abdominal wall and into the peritoneum.  Saline drop test is confirmed peritoneal placement.  Insufflation is initiated with carbon dioxide to pressures of 15  mmHg.  Right para-umbilical local infiltration with quarter percent Marcaine with epinephrine is utilized.  Made a 12 mm incision on the left lateral/ periumbilical site, I advanced an optical 11mm port under direct visualization into the peritoneal cavity.  Once the peritoneum was penetrated, insufflation was initiated.  The trocar was then advanced into the abdominal cavity under direct visualization. Pneumoperitoneum was then continued utilizing CO2 at 15 mmHg or less and tolerated well without any adverse changes in the patient's vital signs.  One 8.5-mm port was placed in the left upper quadrant  With laparoscopic scissors I lysed the midline omental adhesions for subsequent RLQ port placement.  Two 8.5 mm ports are placed to the right lower quadrant, all under direct vision. All skin incisions  were infiltrated with a local anesthetic agent before making the incision and placing the trocars.  The patient was positioned  in reverse Trendelenburg, tilted the patient's left side down.  Da Vinci XI robot was then positioned on to the patient's left side, and docked.  The gallbladder was identified, the fundus grasped via the arm 4 Prograsp and retracted cephalad. Adhesions were lysed with scissors and cautery.  The infundibulum was identified grasped and retracted laterally, exposing the peritoneum overlying the triangle of Calot. This was then opened and dissected using cautery & scissors. An extended critical view of the cystic duct and cystic artery was obtained, aided by the ICG via FireFly which improved localization of the ductal anatomy.    The cystic duct was clearly identified and dissected to isolation.   Artery well isolated and clipped, and the cystic duct was triple clipped and divided with scissors, as close to the gallbladder neck as feasible, thus  leaving two on the remaining stump.  The specimen side of the artery is sealed with bipolar and divided with monopolar scissors.   The  gallbladder was taken from the gallbladder fossa in a retrograde fashion with the electrocautery. The gallbladder was removed and placed in an Endocatch bag.  The liver bed is inspected. Hemostasis was confirmed.  The robot was undocked and moved away from the operative field. No irrigation was utilized.  The gallbladder and Endocatch sac were then removed through the LLQ port site.   Inspection of the right upper quadrant was performed. No bleeding, bile duct injury or leak, or bowel injury was noted. The LLQ port site fascia was closed with interrumpted 0 Vicryl sutures using PMI/cone under direct visualization. This fascial defect was dilated for extraction of the GB, and additional sutures were needed to approximate the fascia at this site. Pneumoperitoneum was released and ports removed.  4-0 subcuticular Monocryl was used to close the skin. Dermabond was  applied.  The patient was then extubated and brought to the recovery room in stable condition. Sponge, lap, and needle counts were correct at closure and at the conclusion of the case.               Campbell Lerner, M.D., [facility] 07/23/2023 9:18 AM

## 2023-07-23 NOTE — Anesthesia Procedure Notes (Signed)
Procedure Name: Intubation Date/Time: 07/23/2023 7:37 AM  Performed by: Lily Lovings, CRNAPre-anesthesia Checklist: Patient identified, Patient being monitored, Timeout performed, Emergency Drugs available and Suction available Patient Re-evaluated:Patient Re-evaluated prior to induction Oxygen Delivery Method: Circle system utilized Preoxygenation: Pre-oxygenation with 100% oxygen Induction Type: IV induction Ventilation: Mask ventilation without difficulty Laryngoscope Size: 3 and McGraph Grade View: Grade I Tube type: Oral Tube size: 7.0 mm Number of attempts: 1 Airway Equipment and Method: Stylet Placement Confirmation: ETT inserted through vocal cords under direct vision, positive ETCO2 and breath sounds checked- equal and bilateral Secured at: 22 cm Tube secured with: Tape Dental Injury: Teeth and Oropharynx as per pre-operative assessment

## 2023-07-23 NOTE — Anesthesia Preprocedure Evaluation (Signed)
Anesthesia Evaluation  Patient identified by MRN, date of birth, ID band Patient awake    Reviewed: Allergy & Precautions, NPO status , Patient's Chart, lab work & pertinent test results  History of Anesthesia Complications Negative for: history of anesthetic complications  Airway Mallampati: III  TM Distance: >3 FB Neck ROM: full    Dental  (+) Chipped, Dental Advidsory Given, Partial Lower, Partial Upper   Pulmonary neg pulmonary ROS   Pulmonary exam normal        Cardiovascular Normal cardiovascular exam+ Valvular Problems/Murmurs      Neuro/Psych negative neurological ROS  negative psych ROS   GI/Hepatic negative GI ROS, Neg liver ROS,,,  Endo/Other    Morbid obesity  Renal/GU      Musculoskeletal   Abdominal   Peds  Hematology negative hematology ROS (+)   Anesthesia Other Findings Past Medical History: No date: BV (bacterial vaginosis) No date: COVID No date: Fatty liver No date: Heart murmur No date: Lipoma, face No date: Mixed hyperlipidemia No date: Onychomycosis No date: Postcoital bleeding  Past Surgical History: No date: ABDOMINAL HYSTERECTOMY     Comment:  [place] bilateral salpingectomy No date: LIPOMA EXCISION     Comment:  back     Reproductive/Obstetrics negative OB ROS                             Anesthesia Physical Anesthesia Plan  ASA: 3  Anesthesia Plan: General ETT   Post-op Pain Management:    Induction: Intravenous  PONV Risk Score and Plan: 4 or greater and Dexamethasone, Ondansetron and Midazolam  Airway Management Planned: Oral ETT  Additional Equipment:   Intra-op Plan:   Post-operative Plan: Extubation in OR  Informed Consent: I have reviewed the patients History and Physical, chart, labs and discussed the procedure including the risks, benefits and alternatives for the proposed anesthesia with the patient or authorized representative  who has indicated his/her understanding and acceptance.     Dental Advisory Given  Plan Discussed with: Anesthesiologist, CRNA and Surgeon  Anesthesia Plan Comments: (Patient consented for risks of anesthesia including but not limited to:  - adverse reactions to medications - damage to eyes, teeth, lips or other oral mucosa - nerve damage due to positioning  - sore throat or hoarseness - Damage to heart, brain, nerves, lungs, other parts of body or loss of life  Patient voiced understanding.)       Anesthesia Quick Evaluation

## 2023-07-23 NOTE — H&P (Signed)
Patient ID: Samantha Velez, female   DOB: Mar 11, 1966, 57 y.o.   MRN: [id number]  Chief Complaint: Abdominal pain x 2 months.   History of Present Illness Samantha Velez is a 57 y.o. female with a 37-month history of centralized abdominal pain with associated vomiting.  This appeared to be postprandial.  Patient had severe exacerbated episode of right upper quadrant pain from which gallstones were diagnosed.  She is also gone undergone HIDA scan which showed adequate ejection fraction.  Without remarkable change in her dietary habits, she has had pretty good control without recurring pain recently.  She has had no fevers or chills, no jaundice, no nausea or vomiting.  Past Medical History Past Medical History:  Diagnosis Date   BV (bacterial vaginosis)    COVID    Fatty liver    Heart murmur    Lipoma, face    Mixed hyperlipidemia    Onychomycosis    Postcoital bleeding       Past Surgical History:  Procedure Laterality Date   ABDOMINAL HYSTERECTOMY     [place] bilateral salpingectomy   LIPOMA EXCISION     back    No Known Allergies  Current Facility-Administered Medications  Medication Dose Route Frequency Provider Last Rate Last Admin   bupivacaine liposome (EXPAREL) 1.3 % injection 266 mg  20 mL Infiltration Once Campbell Lerner, MD       ceFAZolin (ANCEF) IVPB 2g/100 mL premix  2 g Intravenous On Call to OR Campbell Lerner, MD       lactated ringers infusion   Intravenous Continuous Foye Deer, MD        Family History Family History  Problem Relation Age of Onset   Hypertension Father    Breast cancer Paternal Aunt    Colon cancer Paternal Grandfather    Ovarian cancer Neg Hx       Social History Social History   Tobacco Use   Smoking status: Never    Passive exposure: Never   Smokeless tobacco: Never  Vaping Use   Vaping status: Never Used  Substance Use Topics   Alcohol use: No   Drug use: No        Review of Systems  Constitutional:  Negative.   HENT: Negative.    Eyes: Negative.   Respiratory: Negative.    Cardiovascular: Negative.   Gastrointestinal: Negative.   Genitourinary: Negative.   Skin: Negative.   Neurological: Negative.   Psychiatric/Behavioral: Negative.       Physical Exam Blood pressure (!) 147/78, pulse 74, temperature (!) 96.9 F (36.1 C), temperature source Temporal, resp. rate 17, SpO2 97%.   CONSTITUTIONAL: Well developed, and nourished, appropriately responsive and aware without distress.   EYES: Sclera non-icteric.   EARS, NOSE, MOUTH AND THROAT:  The oropharynx is clear. Oral mucosa is pink and moist.   Hearing is intact to voice.  NECK: Trachea is midline, and there is no jugular venous distension.  LYMPH NODES:  Lymph nodes in the neck are not appreciated. RESPIRATORY:  Lungs are clear, and breath sounds are equal bilaterally.  Normal respiratory effort without pathologic use of accessory muscles. CARDIOVASCULAR: Heart is regular in rate and rhythm.   Well perfused.  GI: The abdomen is  soft, nontender, and nondistended. There were no palpable masses.  I did not appreciate hepatosplenomegaly. MUSCULOSKELETAL:  Symmetrical muscle tone appreciated in all four extremities.    SKIN: Skin turgor is normal. No pathologic skin lesions appreciated.  NEUROLOGIC:  Motor and sensation  appear grossly normal.  Cranial nerves are grossly without defect. PSYCH:  Alert and oriented to person, place and time. Affect is appropriate for situation.   Data Reviewed I have personally reviewed what is currently available of the patient's imaging, recent labs and medical records.   Labs:     Latest Ref Rng & Units 05/01/2023   11:31 AM 10/26/2013    6:46 AM 10/20/2013   11:15 AM  CBC  WBC 3.4 - 10.8 x10E3/uL 7.8   8.7   Hemoglobin 11.1 - 15.9 g/dL 16.1  09.6  04.5   Hematocrit 34.0 - 46.6 % 38.9   39.4   Platelets 150 - 440 x10 3/mm 3   226       Latest Ref Rng & Units 05/12/2023   11:13 PM 05/01/2023    11:31 AM 10/26/2013    6:46 AM  CMP  Glucose 70 - 99 mg/dL  91    BUN 6 - 24 mg/dL  7    Creatinine 4.09 - 1.00 mg/dL  8.11  9.14   Sodium 782 - 144 mmol/L  141    Potassium 3.5 - 5.2 mmol/L  3.9    Chloride 96 - 106 mmol/L  103    CO2 20 - 29 mmol/L  21    Calcium 8.7 - 10.2 mg/dL  9.5    Total Protein 6.0 - 8.5 g/dL 7.1  6.7    Total Bilirubin 0.0 - 1.2 mg/dL 0.3  2.7    Alkaline Phos 44 - 121 IU/L 142  254    AST 0 - 40 IU/L 23  435    ALT 0 - 32 IU/L 63  692        Imaging: Radiological images reviewed:  CLINICAL DATA:  Right upper quadrant   EXAM: ULTRASOUND ABDOMEN LIMITED RIGHT UPPER QUADRANT   COMPARISON:  None Available.   FINDINGS: Gallbladder:   Cholelithiasis. Gallbladder minimally thickened. No pericholecystic fluid. Negative sonographic Murphy's sign.   Common bile duct:   Diameter: 5.1 mm   Liver:   Increased echogenicity. There is a 2.5 cm cyst within the right hepatic lobe. No suspicious lesion. Portal vein is patent on color Doppler imaging with normal direction of blood flow towards the liver.   Other: None.   IMPRESSION: 1. Cholelithiasis without secondary signs of acute cholecystitis. 2. Increased hepatic parenchymal echogenicity suggestive of steatosis.     Electronically Signed   By: Annia Belt M.D.   On: 05/05/2023 11:44 Within last 24 hrs: No results found.  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  Optimal plan is for robotic  cholecystectomy utilizing ICG imaging. Risks and benefits have been discussed with the patient which include but are not limited to anesthesia, bleeding, infection, biliary ductal injury, resulting in leak or stenosis, other associated unanticipated injuries affiliated with laparoscopic surgery.   Reviewed that removing the gallbladder will only address the symptoms related to the gallbladder itself.  I believe there is the desire to proceed, accepting the risks with understanding.  Questions elicited and answered to satisfaction.    No guarantees ever expressed or implied.  Face-to-face time spent with the patient and accompanying care providers(if present) was  minutes, with more than 50% of the time spent counseling, educating, and coordinating care of the patient.    These notes generated with voice recognition software. I apologize for typographical errors.  Campbell Lerner M.D., FACS 07/23/2023, 6:48 AM

## 2023-07-23 NOTE — Transfer of Care (Signed)
Immediate Anesthesia Transfer of Care Note  Patient: Samantha Velez  Procedure(s) Performed: XI ROBOTIC ASSISTED LAPAROSCOPIC CHOLECYSTECTOMY (Abdomen) INDOCYANINE GREEN FLUORESCENCE IMAGING (ICG)  Patient Location: PACU  Anesthesia Type:General  Level of Consciousness: drowsy and patient cooperative  Airway & Oxygen Therapy: Patient Spontanous Breathing and Patient connected to nasal cannula oxygen  Post-op Assessment: Report given to RN and Post -op Vital signs reviewed and stable  Post vital signs: Reviewed and stable  Last Vitals:  Vitals Value Taken Time  BP 138/84 07/23/23 0915  Temp 35.9 C 07/23/23 0915  Pulse 64 07/23/23 0926  Resp 17 07/23/23 0926  SpO2 100 % 07/23/23 0926  Vitals shown include unfiled device data.  Last Pain:  Vitals:   07/23/23 0915  TempSrc: Temporal  PainSc: Asleep         Complications: No notable events documented.

## 2023-07-23 NOTE — Discharge Instructions (Addendum)
AMBULATORY SURGERY  DISCHARGE INSTRUCTIONS   The drugs that you were given will stay in your system until tomorrow so for the next 24 hours you should not:  Drive an automobile Make any legal decisions Drink any alcoholic beverage   You may resume regular meals tomorrow.  Today it is better to start with liquids and gradually work up to solid foods.  You may eat anything you prefer, but it is better to start with liquids, then soup and crackers, and gradually work up to solid foods.   Please notify your doctor immediately if you have any unusual bleeding, trouble breathing, redness and pain at the surgery site, drainage, fever, or pain not relieved by medication.   Information for Discharge Teaching:  DO NOT REMOVE TEAL EXPAREL BRACELET FOR 4 Days (96 hours) 07/27/2023 EXPAREL (bupivacaine liposome injectable suspension)   Your surgeon or anesthesiologist gave you EXPAREL(bupivacaine) to help control your pain after surgery.  EXPAREL is a local anesthetic that provides pain relief by numbing the tissue around the surgical site. EXPAREL is designed to release pain medication over time and can control pain for up to 72 hours. Depending on how you respond to EXPAREL, you may require less pain medication during your recovery.  Possible side effects: Temporary loss of sensation or ability to move in the area where bupivacaine was injected. Nausea, vomiting, constipation Rarely, numbness and tingling in your mouth or lips, lightheadedness, or anxiety may occur. Call your doctor right away if you think you may be experiencing any of these sensations, or if you have other questions regarding possible side effects.  Follow all other discharge instructions given to you by your surgeon or nurse. Eat a healthy diet and drink plenty of water or other fluids.  If you return to the hospital for any reason within 96 hours following the administration of EXPAREL, it is important for health care  providers to know that you have received this anesthetic. A teal colored band has been placed on your arm with the date, time and amount of EXPAREL you have received in order to alert and inform your health care providers. Please leave this armband in place for the full 96 hours following administration, and then you may remove the band.

## 2023-07-23 NOTE — Anesthesia Postprocedure Evaluation (Signed)
Anesthesia Post Note  Patient: Samantha Velez  Procedure(s) Performed: XI ROBOTIC ASSISTED LAPAROSCOPIC CHOLECYSTECTOMY (Abdomen) INDOCYANINE GREEN FLUORESCENCE IMAGING (ICG)  Patient location during evaluation: PACU Anesthesia Type: General Level of consciousness: awake and alert Pain management: pain level controlled Vital Signs Assessment: post-procedure vital signs reviewed and stable Respiratory status: spontaneous breathing, nonlabored ventilation, respiratory function stable and patient connected to nasal cannula oxygen Cardiovascular status: blood pressure returned to baseline and stable Postop Assessment: no apparent nausea or vomiting Anesthetic complications: no  No notable events documented.   Last Vitals:  Vitals:   07/23/23 0945 07/23/23 1000  BP: 135/83 (!) 144/78  Pulse: 61 (!) 57  Resp: 16 15  Temp: (!) 36 C   SpO2: 100% 100%    Last Pain:  Vitals:   07/23/23 1000  TempSrc:   PainSc: Asleep                 Stephanie Coup

## 2023-07-24 ENCOUNTER — Telehealth: Payer: Self-pay | Admitting: Surgery

## 2023-07-24 NOTE — Telephone Encounter (Signed)
Patient had robotic cholecystectomy done 07/23/23 with Dr. Claudine Mouton.  Patient said that she was instructed to try and take deep breaths and cough.  She states that she is having some trouble taking a deep breath and seems to be coughing more.  Was wondering if she needed a spirometry device.  Please advise and call patient.

## 2023-07-25 NOTE — Telephone Encounter (Signed)
Called patient again this morning.  She states she has been moving around more and is feeling some better. But again is reminded that if any SOB to go to ER immediately.  Patient verbalized understanding.

## 2023-08-05 ENCOUNTER — Ambulatory Visit: Payer: BC Managed Care – PPO | Admitting: Physician Assistant

## 2023-08-05 ENCOUNTER — Encounter: Payer: Self-pay | Admitting: Physician Assistant

## 2023-08-05 VITALS — BP 132/79 | HR 69 | Temp 97.9°F | Wt 236.8 lb

## 2023-08-05 DIAGNOSIS — K8010 Calculus of gallbladder with chronic cholecystitis without obstruction: Secondary | ICD-10-CM

## 2023-08-05 DIAGNOSIS — Z09 Encounter for follow-up examination after completed treatment for conditions other than malignant neoplasm: Secondary | ICD-10-CM

## 2023-08-05 NOTE — Patient Instructions (Signed)

## 2023-08-05 NOTE — Progress Notes (Signed)
[facility] POST-OP OFFICE VISIT  08/05/2023  HPI: Samantha Velez is a 57 y.o. female 13 days s/p robotic assisted laparoscopic cholecystectomy for [facility] with Dr Claudine Mouton   She is doing well Pain for about 7 days post-op; now just soreness Only using ibuprofen No fever, chills, nausea, emesis Initially constipated; now with occasional diarrhea - no association with diet Incisions well healed Ambulating well No other complaints   Vital signs: BP 132/79   Pulse 69   Temp 97.9 F (36.6 C) (Oral)   Wt 236 lb 12.8 oz (107.4 kg)   SpO2 98%   BMI 39.41 kg/m    Physical Exam: Constitutional: Well appearing female, NAD Abdomen: Soft, non-tender, non-distended, no rebound/guarding Skin: Laparoscopic incisions are healing well, no erythema or drainage   Assessment/Plan: This is a 57 y.o. female 13 days s/p robotic assisted laparoscopic cholecystectomy for CCC with Dr Claudine Mouton    - Pain control prn  - Reviewed wound care recommendation  - Reviewed lifting restrictions; 4 weeks total - work note given  - Reviewed surgical pathology; CCC  - She can follow up on as needed basis; She understands to call with questions/concerns  -- Lynden Oxford, PA-C  [facility] 08/05/2023, 2:28 PM M-F: 7am - 4pm

## 2023-09-09 ENCOUNTER — Ambulatory Visit: Payer: BC Managed Care – PPO | Admitting: Internal Medicine

## 2023-10-14 IMAGING — MG MM DIGITAL SCREENING BILAT W/ TOMO AND CAD
6 of 11 series · 6 of 31 positions shown · non-contrast
Comparison: Previous exam(s).

ACR Breast Density Category a: The breast tissue is almost entirely
fatty.

CLINICAL DATA: Screening.

EXAM:
DIGITAL SCREENING BILATERAL MAMMOGRAM WITH TOMOSYNTHESIS AND CAD
TECHNIQUE: Bilateral screening digital craniocaudal and mediolateral oblique
mammograms were obtained. Bilateral screening digital breast
tomosynthesis was performed. The images were evaluated with
computer-aided detection.

[R CV synth-2D]
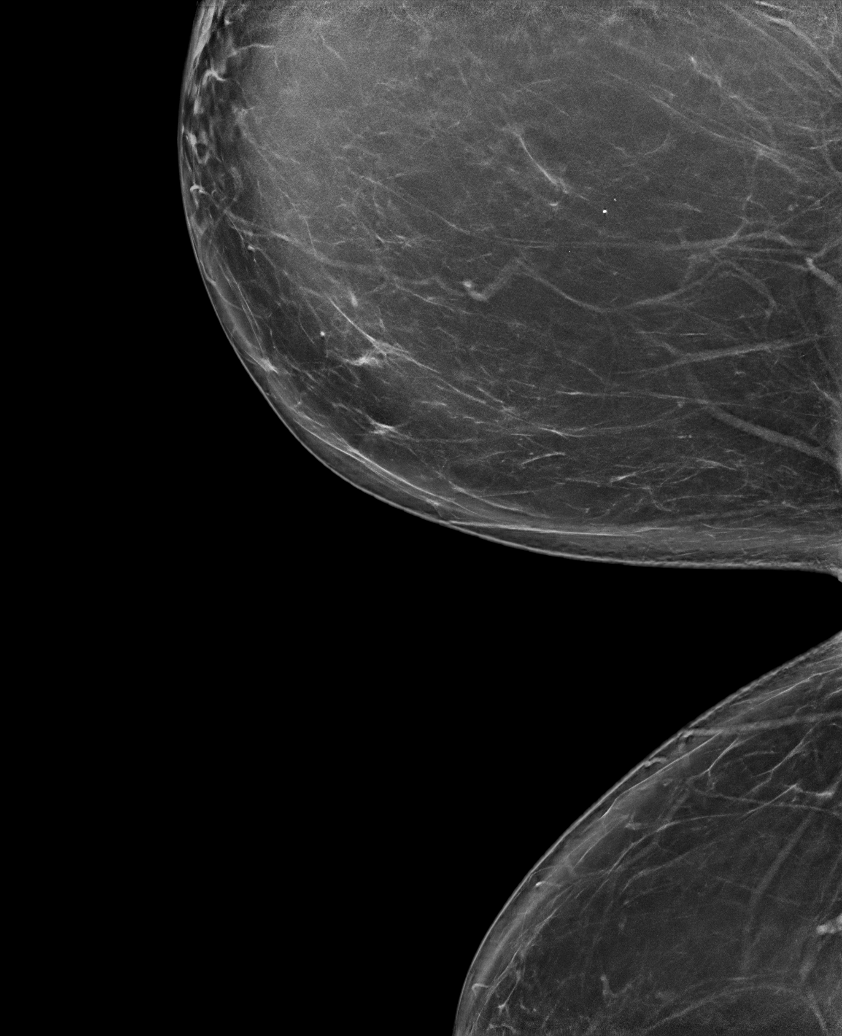

[R CC synth-2D]
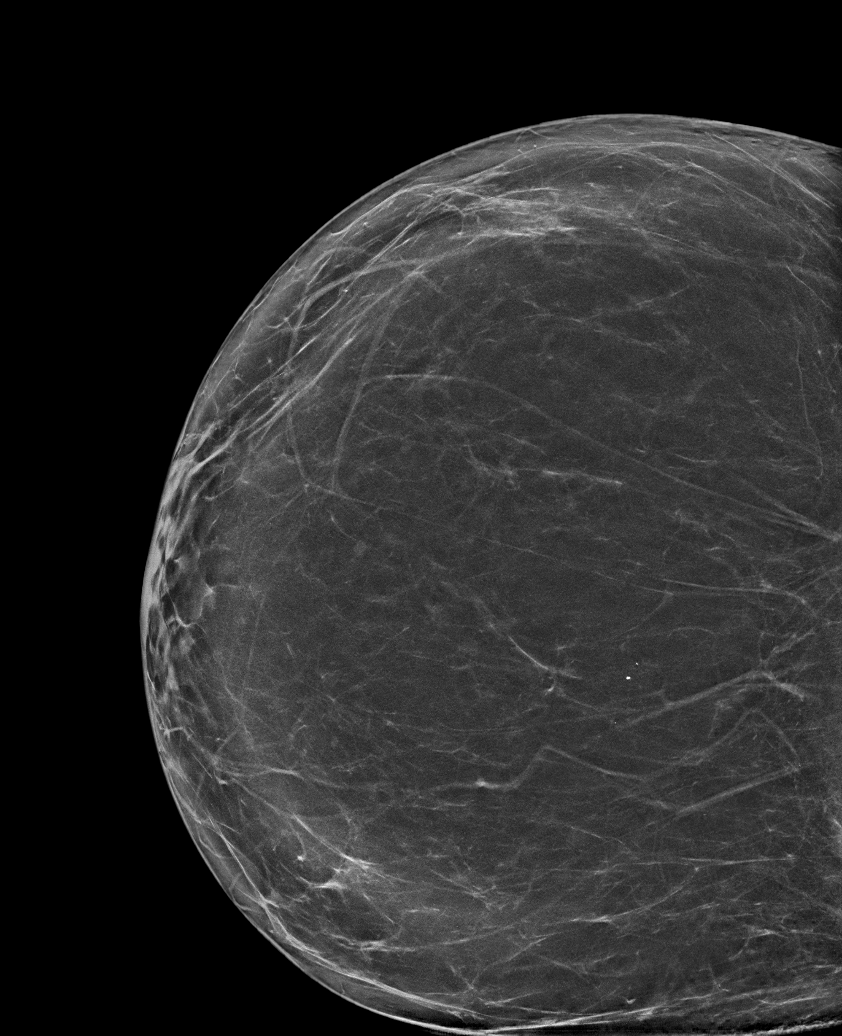

[R MLO synth-2D (1 of 2)]
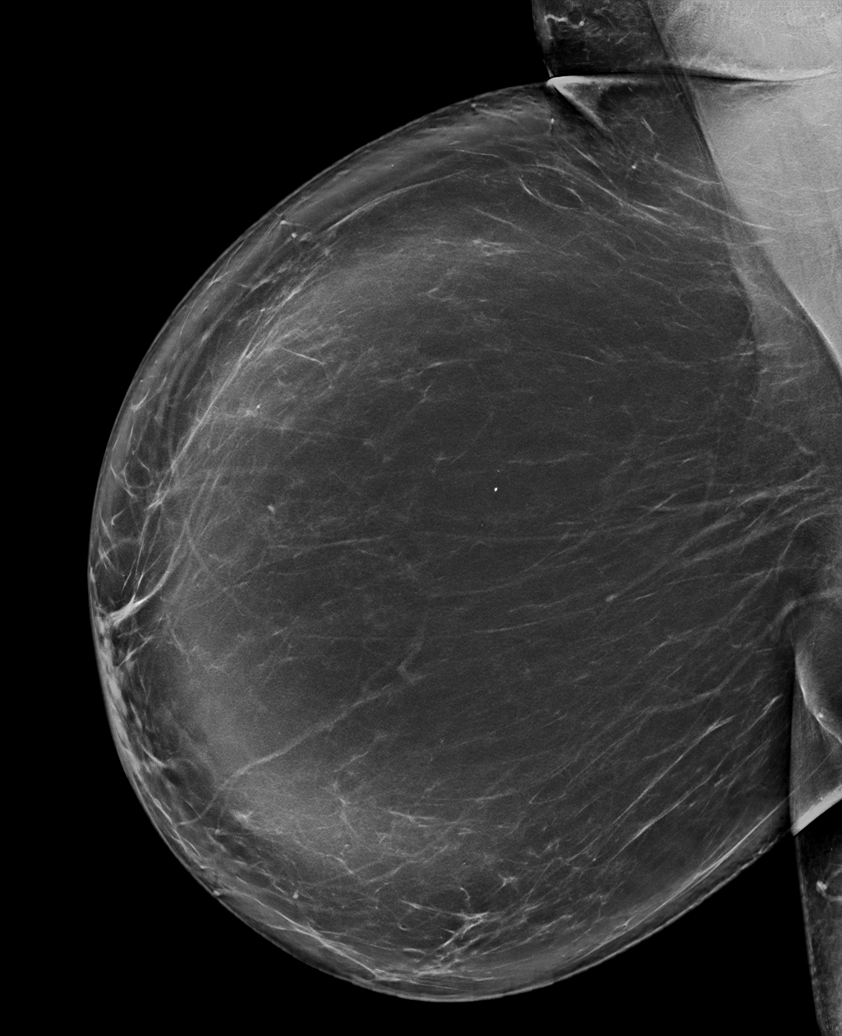

[L MLO synth-2D]
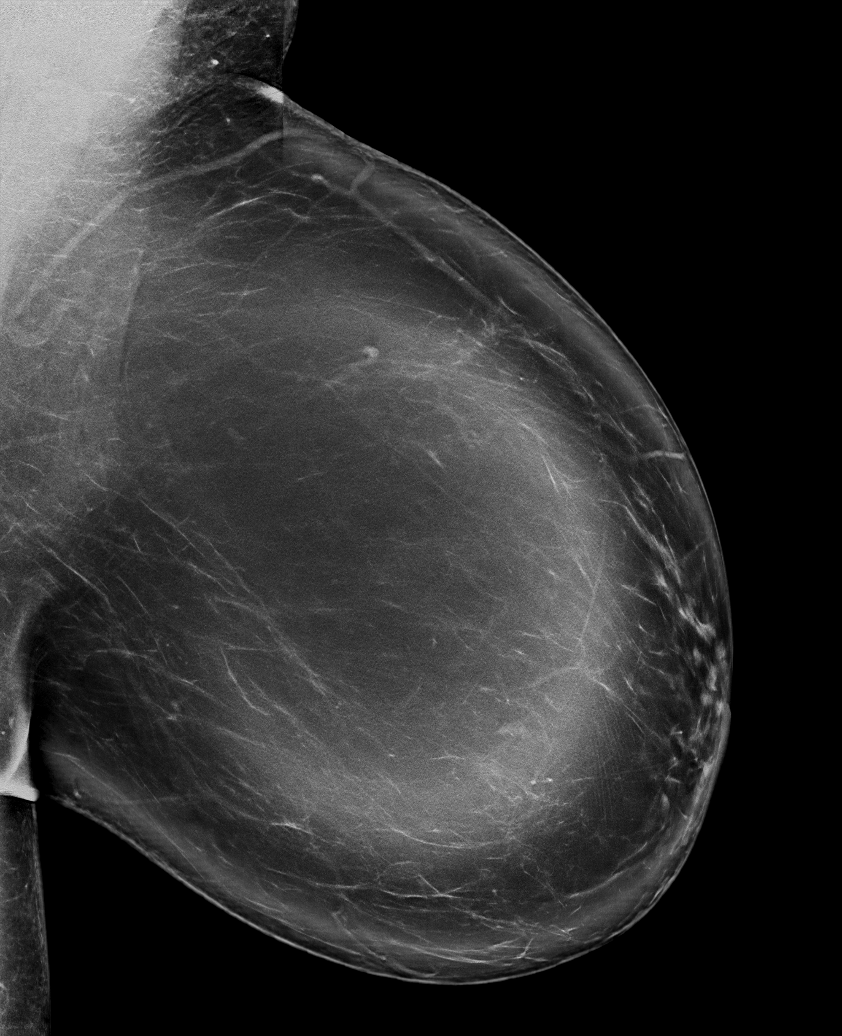

[L CC synth-2D]
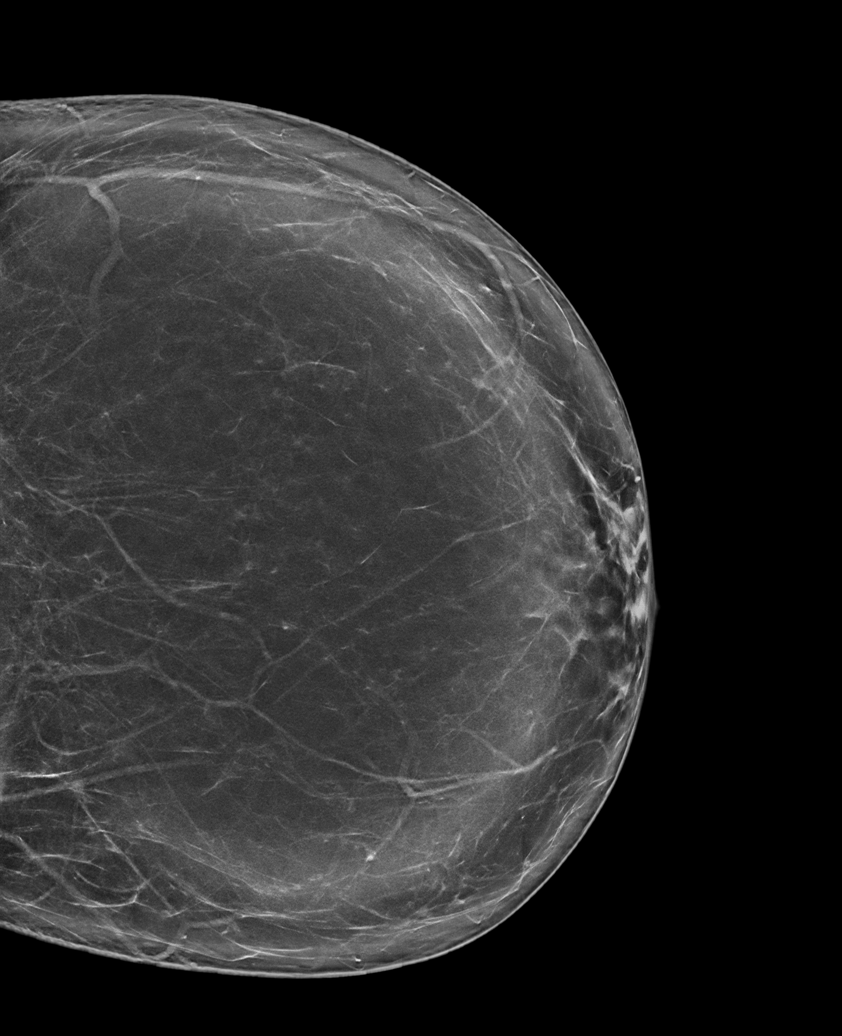

[R MLO synth-2D (2 of 2)]
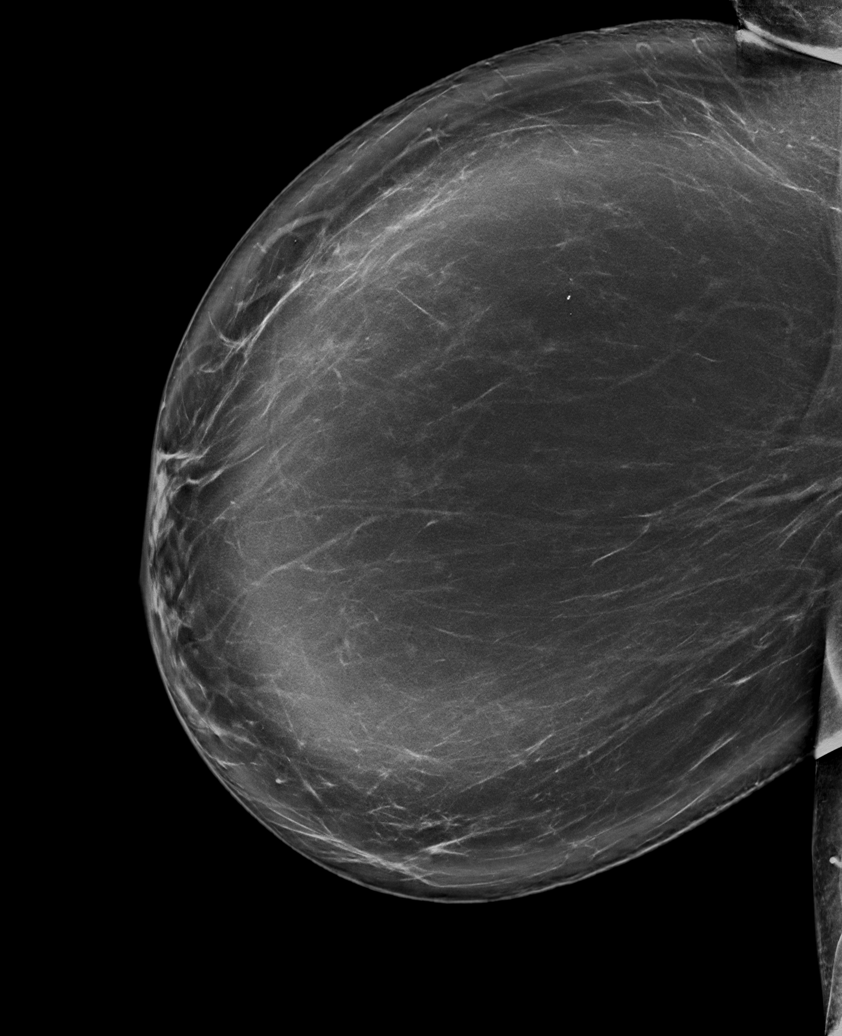

[6 of 31 positions shown; findings below may reference images not displayed]

FINDINGS: There are no findings suspicious for malignancy.
IMPRESSION: No mammographic evidence of malignancy. A result letter of this
screening mammogram will be mailed directly to the patient.

RECOMMENDATION:
Screening mammogram in one year. (Code:0E-3-N98)

BI-RADS CATEGORY  1: Negative.

## 2024-06-04 ENCOUNTER — Other Ambulatory Visit: Payer: Self-pay | Admitting: Internal Medicine

## 2024-06-04 DIAGNOSIS — E782 Mixed hyperlipidemia: Secondary | ICD-10-CM

## 2024-07-06 ENCOUNTER — Ambulatory Visit (INDEPENDENT_AMBULATORY_CARE_PROVIDER_SITE_OTHER): Admitting: Internal Medicine

## 2024-07-06 ENCOUNTER — Encounter: Payer: Self-pay | Admitting: Internal Medicine

## 2024-07-06 VITALS — BP 118/86 | HR 90 | Ht 65.0 in | Wt 247.8 lb

## 2024-07-06 DIAGNOSIS — R030 Elevated blood-pressure reading, without diagnosis of hypertension: Secondary | ICD-10-CM

## 2024-07-06 DIAGNOSIS — R0602 Shortness of breath: Secondary | ICD-10-CM | POA: Diagnosis not present

## 2024-07-06 DIAGNOSIS — Z1231 Encounter for screening mammogram for malignant neoplasm of breast: Secondary | ICD-10-CM | POA: Diagnosis not present

## 2024-07-06 DIAGNOSIS — I071 Rheumatic tricuspid insufficiency: Secondary | ICD-10-CM

## 2024-07-06 DIAGNOSIS — E669 Obesity, unspecified: Secondary | ICD-10-CM

## 2024-07-06 DIAGNOSIS — Z0000 Encounter for general adult medical examination without abnormal findings: Secondary | ICD-10-CM

## 2024-07-06 DIAGNOSIS — E782 Mixed hyperlipidemia: Secondary | ICD-10-CM

## 2024-07-06 DIAGNOSIS — Z0001 Encounter for general adult medical examination with abnormal findings: Secondary | ICD-10-CM

## 2024-07-06 DIAGNOSIS — R9431 Abnormal electrocardiogram [ECG] [EKG]: Secondary | ICD-10-CM

## 2024-07-06 DIAGNOSIS — Z0130 Encounter for examination of blood pressure without abnormal findings: Secondary | ICD-10-CM

## 2024-07-06 NOTE — Progress Notes (Signed)
 Established Patient Office Visit  Subjective:  Patient ID: Samantha Velez, female    DOB: [birth year]  Age: 58 y.o. MRN: [id number]  Chief Complaint  Patient presents with   Annual Exam    CPE    Patient comes in for a follow-up today.  She has not been in since August 2024.  Since then she has had a laparoscopic cholecystectomy.  Today she is concerned about her excessive weight gain.  She admits to not exercising regularly and some dietary noncompliance.  Patient has a history of elevated cholesterol, currently on Crestor , will check her labs today.  She is also concerned about intermittent shortness of breath which happens usually while at rest. Her EKG done today shows T wave inversions and it is low voltage.  Patient has history of valvular regurgitation especially tricuspid valve.  Will schedule an echocardiogram, stress test and cardiology referral.   Patient also needs to be scheduled for mammogram. Last colonoscopy was 2019. Last Pap was 2021. Mentions hot flashes, night sweats and mood swings.    No other concerns at this time.   Past Medical History:  Diagnosis Date   BV (bacterial vaginosis)    COVID    Fatty liver    Heart murmur    Lipoma, face    Mixed hyperlipidemia    Onychomycosis    Postcoital bleeding     Past Surgical History:  Procedure Laterality Date   ABDOMINAL HYSTERECTOMY     tah bilateral salpingectomy   LIPOMA EXCISION     back    Social History   Socioeconomic History   Marital status: Married    Spouse name: Not on file   Number of children: Not on file   Years of education: Not on file   Highest education level: Not on file  Occupational History   Not on file  Tobacco Use   Smoking status: Never    Passive exposure: Never   Smokeless tobacco: Never  Vaping Use   Vaping status: Never Used  Substance and Sexual Activity   Alcohol use: No   Drug use: No   Sexual activity: Yes    Birth control/protection: Surgical  Other  Topics Concern   Not on file  Social History Narrative   Not on file   Social Drivers of Health   Financial Resource Strain: Not on file  Food Insecurity: Not on file  Transportation Needs: Not on file  Physical Activity: Not on file  Stress: Not on file  Social Connections: Not on file  Intimate Partner Violence: Not on file    Family History  Problem Relation Age of Onset   Hypertension Father    Breast cancer Paternal Aunt    Colon cancer Paternal Grandfather    Ovarian cancer Neg Hx     No Known Allergies  Outpatient Medications Prior to Visit  Medication Sig   ibuprofen (ADVIL,MOTRIN) 200 MG tablet Take 200 mg by mouth every 6 (six) hours as needed. Headache   Multiple Vitamin (MULTIVITAMIN WITH MINERALS) TABS Take 1 tablet by mouth daily.   rosuvastatin  (CRESTOR ) 10 MG tablet TAKE 1 TABLET(10 MG) BY MOUTH DAILY   No facility-administered medications prior to visit.    Review of Systems  Constitutional: Negative.  Negative for chills, diaphoresis, fever, malaise/fatigue and weight loss.  HENT: Negative.  Negative for congestion and sore throat.   Eyes: Negative.   Respiratory:  Positive for shortness of breath. Negative for cough.   Cardiovascular: Negative.  Negative for chest pain, palpitations and leg swelling.  Gastrointestinal: Negative.  Negative for abdominal pain, constipation, diarrhea, heartburn, nausea and vomiting.  Genitourinary: Negative.  Negative for dysuria and flank pain.  Musculoskeletal: Negative.  Negative for joint pain and myalgias.  Skin: Negative.   Neurological: Negative.  Negative for dizziness, tingling, tremors and headaches.  Endo/Heme/Allergies: Negative.   Psychiatric/Behavioral: Negative.  Negative for depression and suicidal ideas. The patient is not nervous/anxious.        Objective:   BP 118/86   Pulse 90   Ht 5' 5 (1.651 m)   Wt 247 lb 12.8 oz (112.4 kg)   SpO2 98%   BMI 41.24 kg/m   Vitals:   07/06/24 1252   BP: 118/86  Pulse: 90  Height: 5' 5 (1.651 m)  Weight: 247 lb 12.8 oz (112.4 kg)  SpO2: 98%  BMI (Calculated): 41.24    Physical Exam Vitals and nursing note reviewed.  Constitutional:      Appearance: Normal appearance.  HENT:     Head: Normocephalic and atraumatic.     Nose: Nose normal.     Mouth/Throat:     Mouth: Mucous membranes are moist.     Pharynx: Oropharynx is clear.  Eyes:     Conjunctiva/sclera: Conjunctivae normal.     Pupils: Pupils are equal, round, and reactive to light.  Cardiovascular:     Rate and Rhythm: Normal rate and regular rhythm.     Pulses: Normal pulses.     Heart sounds: Murmur heard.  Pulmonary:     Effort: Pulmonary effort is normal.     Breath sounds: Normal breath sounds. No wheezing.  Abdominal:     General: Bowel sounds are normal.     Palpations: Abdomen is soft.     Tenderness: There is no abdominal tenderness. There is no right CVA tenderness or left CVA tenderness.  Musculoskeletal:        General: Normal range of motion.     Cervical back: Normal range of motion.     Right lower leg: No edema.     Left lower leg: No edema.  Skin:    General: Skin is warm and dry.  Neurological:     General: No focal deficit present.     Mental Status: She is alert and oriented to person, place, and time.  Psychiatric:        Mood and Affect: Mood normal.        Behavior: Behavior normal.      No results found for any visits on 07/06/24.  No results found for this or any previous visit (from the past 2160 hours).    Assessment & Plan:  Check blood work today.  Schedule mammogram.  Needs cardiology referral along with echocardiogram and stress test.  Strict diet control emphasized.  Will discuss further weight loss options at follow-up. Problem List Items Addressed This Visit     Mixed hyperlipidemia   Relevant Orders   CMP14+EGFR   Lipid Panel w/o Chol/HDL Ratio   TSH+T4F+T3Free   Prehypertension   Obesity (BMI 30-39.9)    Relevant Orders   TSH+T4F+T3Free   Other Visit Diagnoses       Shortness of breath    -  Primary   Relevant Orders   CBC with Diff   TSH+T4F+T3Free   EKG 12-Lead   Ambulatory referral to Cardiology     Breast cancer screening by mammogram       Relevant Orders   MM  3D SCREENING MAMMOGRAM BILATERAL BREAST     Tricuspid valve insufficiency, unspecified etiology       Relevant Orders   PCV ECHOCARDIOGRAM COMPLETE     Abnormal electrocardiogram       Relevant Orders   Myocardial Perfusion Imaging   Ambulatory referral to Cardiology     Annual physical exam           Return in about 1 month (around 08/06/2024).   Total time spent: 30 minutes  FERNAND FREDY RAMAN, MD  07/06/2024   This document may have been prepared by [facility] Voice Recognition software and as such may include unintentional dictation errors.

## 2024-07-07 LAB — LIPID PANEL W/O CHOL/HDL RATIO
Cholesterol, Total: 157 mg/dL (ref 100–199)
HDL: 48 mg/dL (ref >39)
LDL Chol Calc (NIH): 91 mg/dL (ref 0–99)
Triglycerides: 100 mg/dL (ref 0–149)
VLDL Cholesterol Cal: 18 mg/dL (ref 5–40)

## 2024-07-07 LAB — CMP14+EGFR
ALT: 24 IU/L (ref 0–32)
AST: 22 IU/L (ref 0–40)
Albumin: 4.1 g/dL (ref 3.8–4.9)
Alkaline Phosphatase: 96 IU/L (ref 44–121)
BUN/Creatinine Ratio: 13 (ref 9–23)
BUN: 11 mg/dL (ref 6–24)
Bilirubin Total: 0.4 mg/dL (ref 0.0–1.2)
CO2: 22 mmol/L (ref 20–29)
Calcium: 9.5 mg/dL (ref 8.7–10.2)
Chloride: 104 mmol/L (ref 96–106)
Creatinine, Ser: 0.83 mg/dL (ref 0.57–1.00)
Globulin, Total: 2.8 g/dL (ref 1.5–4.5)
Glucose: 84 mg/dL (ref 70–99)
Potassium: 4.1 mmol/L (ref 3.5–5.2)
Sodium: 139 mmol/L (ref 134–144)
Total Protein: 6.9 g/dL (ref 6.0–8.5)
eGFR: 83 mL/min/1.73 (ref >59)

## 2024-07-07 LAB — CBC WITH DIFFERENTIAL/PLATELET
Basophils Absolute: 0 x10E3/uL (ref 0.0–0.2)
Basos: 0 %
EOS (ABSOLUTE): 0.1 x10E3/uL (ref 0.0–0.4)
Eos: 1 %
Hematocrit: 39.1 % (ref 34.0–46.6)
Hemoglobin: 12.7 g/dL (ref 11.1–15.9)
Immature Grans (Abs): 0 x10E3/uL (ref 0.0–0.1)
Immature Granulocytes: 0 %
Lymphocytes Absolute: 2.5 x10E3/uL (ref 0.7–3.1)
Lymphs: 28 %
MCH: 31.1 pg (ref 26.6–33.0)
MCHC: 32.5 g/dL (ref 31.5–35.7)
MCV: 96 fL (ref 79–97)
Monocytes Absolute: 0.5 x10E3/uL (ref 0.1–0.9)
Monocytes: 6 %
Neutrophils Absolute: 5.8 x10E3/uL (ref 1.4–7.0)
Neutrophils: 65 %
Platelets: 269 x10E3/uL (ref 150–450)
RBC: 4.09 x10E6/uL (ref 3.77–5.28)
RDW: 13.6 % (ref 11.7–15.4)
WBC: 9 x10E3/uL (ref 3.4–10.8)

## 2024-07-07 LAB — TSH+T4F+T3FREE
Free T4: 0.95 ng/dL (ref 0.82–1.77)
T3, Free: 3.1 pg/mL (ref 2.0–4.4)
TSH: 1.31 u[IU]/mL (ref 0.450–4.500)

## 2024-07-08 ENCOUNTER — Ambulatory Visit: Payer: Self-pay | Admitting: Internal Medicine

## 2024-07-19 ENCOUNTER — Ambulatory Visit

## 2024-07-19 DIAGNOSIS — R9431 Abnormal electrocardiogram [ECG] [EKG]: Secondary | ICD-10-CM

## 2024-07-30 ENCOUNTER — Ambulatory Visit

## 2024-07-30 DIAGNOSIS — I361 Nonrheumatic tricuspid (valve) insufficiency: Secondary | ICD-10-CM | POA: Diagnosis not present

## 2024-07-30 DIAGNOSIS — I071 Rheumatic tricuspid insufficiency: Secondary | ICD-10-CM

## 2024-07-30 DIAGNOSIS — I340 Nonrheumatic mitral (valve) insufficiency: Secondary | ICD-10-CM

## 2024-08-06 ENCOUNTER — Encounter: Payer: Self-pay | Admitting: Cardiovascular Disease

## 2024-08-06 ENCOUNTER — Ambulatory Visit: Admitting: Cardiovascular Disease

## 2024-08-06 VITALS — BP 126/80 | HR 82 | Ht 65.0 in | Wt 251.6 lb

## 2024-08-06 DIAGNOSIS — E782 Mixed hyperlipidemia: Secondary | ICD-10-CM

## 2024-08-06 DIAGNOSIS — I071 Rheumatic tricuspid insufficiency: Secondary | ICD-10-CM | POA: Diagnosis not present

## 2024-08-06 DIAGNOSIS — I340 Nonrheumatic mitral (valve) insufficiency: Secondary | ICD-10-CM | POA: Diagnosis not present

## 2024-08-06 DIAGNOSIS — Z0130 Encounter for examination of blood pressure without abnormal findings: Secondary | ICD-10-CM

## 2024-08-06 DIAGNOSIS — R0602 Shortness of breath: Secondary | ICD-10-CM

## 2024-08-06 MED ORDER — DAPAGLIFLOZIN PROPANEDIOL 10 MG PO TABS
10.0000 mg | ORAL_TABLET | Freq: Every day | ORAL | 3 refills | Status: AC
Start: 2024-08-06 — End: ?

## 2024-08-06 NOTE — Progress Notes (Signed)
 Cardiology Office Note   Date:  08/06/2024   ID:  [name], [name] 10/17/1966, MRN [id number]  PCP:  Fernand Fredy RAMAN, MD  Cardiologist:  Denyse Fernand, MD      History of Present Illness: Samantha Velez is a 58 y.o. female who presents for  Chief Complaint  Patient presents with   Follow-up    Consult    27 YOWF has Shortness of breath with h/o heart murmur. No chest pain palpitation or syncope.      Past Medical History:  Diagnosis Date   BV (bacterial vaginosis)    COVID    Fatty liver    Heart murmur    Lipoma, face    Mixed hyperlipidemia    Onychomycosis    Postcoital bleeding      Past Surgical History:  Procedure Laterality Date   ABDOMINAL HYSTERECTOMY     [place] bilateral salpingectomy   LIPOMA EXCISION     back     Current Outpatient Medications  Medication Sig Dispense Refill   dapagliflozin propanediol (FARXIGA) 10 MG TABS tablet Take 1 tablet (10 mg total) by mouth daily. 90 tablet 3   ibuprofen (ADVIL,MOTRIN) 200 MG tablet Take 200 mg by mouth every 6 (six) hours as needed. Headache     Multiple Vitamin (MULTIVITAMIN WITH MINERALS) TABS Take 1 tablet by mouth daily.     rosuvastatin (CRESTOR) 10 MG tablet TAKE 1 TABLET(10 MG) BY MOUTH DAILY 30 tablet 11   No current facility-administered medications for this visit.    Allergies:   Patient has no known allergies.    Social History:   reports that she has never smoked. She has never been exposed to tobacco smoke. She has never used smokeless tobacco. She reports that she does not drink alcohol and does not use drugs.   Family History:  family history includes Breast cancer in her paternal aunt; Colon cancer in her paternal grandfather; Hypertension in her father.    ROS:     Review of Systems  Constitutional: Negative.   HENT: Negative.    Eyes: Negative.   Respiratory: Negative.    Gastrointestinal: Negative.   Genitourinary: Negative.   Musculoskeletal: Negative.   Skin: Negative.    Neurological: Negative.   Endo/Heme/Allergies: Negative.   Psychiatric/Behavioral: Negative.    All other systems reviewed and are negative.     All other systems are reviewed and negative.    PHYSICAL EXAM: VS:  BP 126/80   Pulse 82   Ht 5' 5 (1.651 m)   Wt 251 lb 9.6 oz (114.1 kg)   SpO2 95%   BMI 41.87 kg/m  , BMI Body mass index is 41.87 kg/m. Last weight:  Wt Readings from Last 3 Encounters:  08/06/24 251 lb 9.6 oz (114.1 kg)  07/06/24 247 lb 12.8 oz (112.4 kg)  08/05/23 236 lb 12.8 oz (107.4 kg)     Physical Exam Constitutional:      Appearance: Normal appearance.  Cardiovascular:     Rate and Rhythm: Normal rate and regular rhythm.     Heart sounds: Normal heart sounds.  Pulmonary:     Effort: Pulmonary effort is normal.     Breath sounds: Normal breath sounds.  Musculoskeletal:     Right lower leg: No edema.     Left lower leg: No edema.  Neurological:     Mental Status: She is alert.       EKG:   Recent Labs: 07/06/2024: ALT 24;  BUN 11; Creatinine, Ser 0.83; Hemoglobin 12.7; Platelets 269; Potassium 4.1; Sodium 139; TSH 1.310    Lipid Panel    Component Value Date/Time   CHOL 157 07/06/2024 1354   TRIG 100 07/06/2024 1354   HDL 48 07/06/2024 1354   LDLCALC 91 07/06/2024 1354      Other studies Reviewed: Additional studies/ records that were reviewed today include:  Review of the above records demonstrates:       No data to display            ASSESSMENT AND PLAN:    ICD-10-CM   1. Tricuspid valve insufficiency, unspecified etiology  I07.1 dapagliflozin  propanediol (FARXIGA ) 10 MG TABS tablet   Moderate TR with mild Pulm. HTN.    2. Shortness of breath  R06.02 dapagliflozin  propanediol (FARXIGA ) 10 MG TABS tablet   Has mild pulmonary HT and moderate TR, normal EF, advise sleep study. Will try farxiag although EF was normal and no diastiolic dysfunction.    3. Mixed hyperlipidemia  E78.2 dapagliflozin  propanediol (FARXIGA ) 10  MG TABS tablet    4. Nonrheumatic mitral valve regurgitation  I34.0 dapagliflozin  propanediol (FARXIGA ) 10 MG TABS tablet   mild MR, normal LVEF. Moderate TR.       Problem List Items Addressed This Visit       Other   Mixed hyperlipidemia   Relevant Medications   dapagliflozin  propanediol (FARXIGA ) 10 MG TABS tablet   Other Visit Diagnoses       Tricuspid valve insufficiency, unspecified etiology    -  Primary   Moderate TR with mild Pulm. HTN.   Relevant Medications   dapagliflozin  propanediol (FARXIGA ) 10 MG TABS tablet     Shortness of breath       Has mild pulmonary HT and moderate TR, normal EF, advise sleep study. Will try farxiag although EF was normal and no diastiolic dysfunction.   Relevant Medications   dapagliflozin  propanediol (FARXIGA ) 10 MG TABS tablet     Nonrheumatic mitral valve regurgitation       mild MR, normal LVEF. Moderate TR.   Relevant Medications   dapagliflozin  propanediol (FARXIGA ) 10 MG TABS tablet          Disposition:   Return in about 4 weeks (around 09/03/2024).    Total time spent: 50 minutes  Signed,  Denyse Bathe, MD  08/06/2024 2:44 PM    [facility]

## 2024-08-10 ENCOUNTER — Ambulatory Visit (INDEPENDENT_AMBULATORY_CARE_PROVIDER_SITE_OTHER): Admitting: Internal Medicine

## 2024-08-10 ENCOUNTER — Encounter: Payer: Self-pay | Admitting: Internal Medicine

## 2024-08-10 VITALS — BP 120/84 | HR 72 | Ht 65.0 in | Wt 246.8 lb

## 2024-08-10 DIAGNOSIS — I071 Rheumatic tricuspid insufficiency: Secondary | ICD-10-CM | POA: Diagnosis not present

## 2024-08-10 DIAGNOSIS — E669 Obesity, unspecified: Secondary | ICD-10-CM

## 2024-08-10 DIAGNOSIS — E782 Mixed hyperlipidemia: Secondary | ICD-10-CM

## 2024-08-10 DIAGNOSIS — R0602 Shortness of breath: Secondary | ICD-10-CM

## 2024-08-10 DIAGNOSIS — Z1231 Encounter for screening mammogram for malignant neoplasm of breast: Secondary | ICD-10-CM

## 2024-08-10 NOTE — Progress Notes (Signed)
 Established Patient Office Visit  Subjective:  Patient ID: Samantha Velez, female    DOB: 02-Apr-1966  Age: 58 y.o. MRN: [id number]  Chief Complaint  Patient presents with   Follow-up    1 month follow up    Patient was seen today for her 1 month follow up visit. She reports doing well and denies and complaints at this time. She reports shortness of breath has resolved since her last appointment. She had a recent echo done and shows a stable tricuspid valve regurgitation. She has a past due mammogram that she will call and set up an appointment.   No other concerns at this time.   Past Medical History:  Diagnosis Date   BV (bacterial vaginosis)    COVID    Fatty liver    Heart murmur    Lipoma, face    Mixed hyperlipidemia    Onychomycosis    Postcoital bleeding     Past Surgical History:  Procedure Laterality Date   ABDOMINAL HYSTERECTOMY     [place] bilateral salpingectomy   LIPOMA EXCISION     back    Social History   Socioeconomic History   Marital status: Married    Spouse name: Not on file   Number of children: Not on file   Years of education: Not on file   Highest education level: Not on file  Occupational History   Not on file  Tobacco Use   Smoking status: Never    Passive exposure: Never   Smokeless tobacco: Never  Vaping Use   Vaping status: Never Used  Substance and Sexual Activity   Alcohol use: No   Drug use: No   Sexual activity: Yes    Birth control/protection: Surgical  Other Topics Concern   Not on file  Social History Narrative   Not on file   Social Drivers of Health   Financial Resource Strain: Not on file  Food Insecurity: Not on file  Transportation Needs: Not on file  Physical Activity: Not on file  Stress: Not on file  Social Connections: Not on file  Intimate Partner Violence: Not on file    Family History  Problem Relation Age of Onset   Hypertension Father    Breast cancer Paternal Aunt    Colon cancer Paternal  Grandfather    Ovarian cancer Neg Hx     No Known Allergies  Outpatient Medications Prior to Visit  Medication Sig   dapagliflozin  propanediol (FARXIGA ) 10 MG TABS tablet Take 1 tablet (10 mg total) by mouth daily.   ibuprofen (ADVIL,MOTRIN) 200 MG tablet Take 200 mg by mouth every 6 (six) hours as needed. Headache   Multiple Vitamin (MULTIVITAMIN WITH MINERALS) TABS Take 1 tablet by mouth daily.   rosuvastatin  (CRESTOR ) 10 MG tablet TAKE 1 TABLET(10 MG) BY MOUTH DAILY   No facility-administered medications prior to visit.    Review of Systems  Constitutional: Negative.   HENT: Negative.    Eyes: Negative.   Respiratory: Negative.  Negative for cough and shortness of breath.   Cardiovascular: Negative.  Negative for chest pain, palpitations and leg swelling.  Gastrointestinal: Negative.  Negative for abdominal pain, constipation, diarrhea, heartburn, nausea and vomiting.  Genitourinary: Negative.  Negative for dysuria and flank pain.  Musculoskeletal: Negative.  Negative for joint pain and myalgias.  Skin: Negative.   Neurological: Negative.  Negative for dizziness and headaches.  Endo/Heme/Allergies: Negative.   Psychiatric/Behavioral: Negative.  Negative for depression and suicidal ideas. The patient is  not nervous/anxious.        Objective:   BP 120/84   Pulse 72   Ht 5' 5 (1.651 m)   Wt 246 lb 12.8 oz (111.9 kg)   SpO2 98%   BMI 41.07 kg/m   Vitals:   08/10/24 1335  BP: 120/84  Pulse: 72  Height: 5' 5 (1.651 m)  Weight: 246 lb 12.8 oz (111.9 kg)  SpO2: 98%  BMI (Calculated): 41.07    Physical Exam Vitals and nursing note reviewed.  Constitutional:      Appearance: Normal appearance.  HENT:     Head: Normocephalic and atraumatic.     Nose: Nose normal.     Mouth/Throat:     Mouth: Mucous membranes are moist.     Pharynx: Oropharynx is clear.  Eyes:     Conjunctiva/sclera: Conjunctivae normal.     Pupils: Pupils are equal, round, and reactive to  light.  Cardiovascular:     Rate and Rhythm: Normal rate and regular rhythm.     Pulses: Normal pulses.     Heart sounds: Murmur heard.  Pulmonary:     Effort: Pulmonary effort is normal.     Breath sounds: Normal breath sounds. No wheezing.  Abdominal:     General: Bowel sounds are normal.     Palpations: Abdomen is soft.     Tenderness: There is no abdominal tenderness. There is no right CVA tenderness or left CVA tenderness.  Musculoskeletal:        General: Normal range of motion.     Cervical back: Normal range of motion.     Right lower leg: No edema.     Left lower leg: No edema.  Skin:    General: Skin is warm and dry.  Neurological:     General: No focal deficit present.     Mental Status: She is alert and oriented to person, place, and time.  Psychiatric:        Mood and Affect: Mood normal.        Behavior: Behavior normal.      No results found for any visits on 08/10/24.  Recent Results (from the past 2160 hours)  CMP14+EGFR     Status: None   Collection Time: 07/06/24  1:54 PM  Result Value Ref Range   Glucose 84 70 - 99 mg/dL   BUN 11 6 - 24 mg/dL   Creatinine, Ser 9.16 0.57 - 1.00 mg/dL   eGFR 83 >40 fO/fpw/8.26   BUN/Creatinine Ratio 13 9 - 23   Sodium 139 134 - 144 mmol/L   Potassium 4.1 3.5 - 5.2 mmol/L   Chloride 104 96 - 106 mmol/L   CO2 22 20 - 29 mmol/L   Calcium  9.5 8.7 - 10.2 mg/dL   Total Protein 6.9 6.0 - 8.5 g/dL   Albumin 4.1 3.8 - 4.9 g/dL   Globulin, Total 2.8 1.5 - 4.5 g/dL   Bilirubin Total 0.4 0.0 - 1.2 mg/dL   Alkaline Phosphatase 96 44 - 121 IU/L   AST 22 0 - 40 IU/L   ALT 24 0 - 32 IU/L  Lipid Panel w/o Chol/HDL Ratio     Status: None   Collection Time: 07/06/24  1:54 PM  Result Value Ref Range   Cholesterol, Total 157 100 - 199 mg/dL   Triglycerides 899 0 - 149 mg/dL   HDL 48 >60 mg/dL   VLDL Cholesterol Cal 18 5 - 40 mg/dL   LDL Chol Calc (NIH) 91 0 -  99 mg/dL  CBC with Diff     Status: None   Collection Time:  07/06/24  1:54 PM  Result Value Ref Range   WBC 9.0 3.4 - 10.8 x10E3/uL   RBC 4.09 3.77 - 5.28 x10E6/uL   Hemoglobin 12.7 11.1 - 15.9 g/dL   Hematocrit 60.8 65.9 - 46.6 %   MCV 96 79 - 97 fL   MCH 31.1 26.6 - 33.0 pg   MCHC 32.5 31.5 - 35.7 g/dL   RDW 86.3 88.2 - 84.5 %   Platelets 269 150 - 450 x10E3/uL   Neutrophils 65 Not Estab. %   Lymphs 28 Not Estab. %   Monocytes 6 Not Estab. %   Eos 1 Not Estab. %   Basos 0 Not Estab. %   Neutrophils Absolute 5.8 1.4 - 7.0 x10E3/uL   Lymphocytes Absolute 2.5 0.7 - 3.1 x10E3/uL   Monocytes Absolute 0.5 0.1 - 0.9 x10E3/uL   EOS (ABSOLUTE) 0.1 0.0 - 0.4 x10E3/uL   Basophils Absolute 0.0 0.0 - 0.2 x10E3/uL   Immature Granulocytes 0 Not Estab. %   Immature Grans (Abs) 0.0 0.0 - 0.1 x10E3/uL  TSH+T4F+T3Free     Status: None   Collection Time: 07/06/24  1:54 PM  Result Value Ref Range   TSH 1.310 0.450 - 4.500 uIU/mL   T3, Free 3.1 2.0 - 4.4 pg/mL   Free T4 0.95 0.82 - 1.77 ng/dL      Assessment & Plan:  Continue taking medications as prescribed. Echo results showed stable tricuspid valve regurgitation. Patient will schedule screening mammogram that is past due. Continue diet and exercise routine as discussed. Problem List Items Addressed This Visit     Mixed hyperlipidemia   Other Visit Diagnoses       Shortness of breath    -  Primary     Tricuspid valve insufficiency, unspecified etiology         Breast cancer screening by mammogram           Return in about 4 months (around 12/10/2024).   Total time spent: 25 minutes  FERNAND FREDY RAMAN, MD  08/10/2024   This document may have been prepared by [facility] Voice Recognition software and as such may include unintentional dictation errors.

## 2024-09-03 ENCOUNTER — Encounter: Payer: Self-pay | Admitting: Cardiovascular Disease

## 2024-09-03 ENCOUNTER — Ambulatory Visit: Admitting: Cardiovascular Disease

## 2024-09-03 VITALS — BP 130/80 | HR 71 | Ht 65.0 in | Wt 245.0 lb

## 2024-09-03 DIAGNOSIS — I340 Nonrheumatic mitral (valve) insufficiency: Secondary | ICD-10-CM | POA: Diagnosis not present

## 2024-09-03 DIAGNOSIS — E782 Mixed hyperlipidemia: Secondary | ICD-10-CM | POA: Diagnosis not present

## 2024-09-03 DIAGNOSIS — R0602 Shortness of breath: Secondary | ICD-10-CM | POA: Diagnosis not present

## 2024-09-03 DIAGNOSIS — E669 Obesity, unspecified: Secondary | ICD-10-CM

## 2024-09-03 DIAGNOSIS — I071 Rheumatic tricuspid insufficiency: Secondary | ICD-10-CM

## 2024-09-03 NOTE — Progress Notes (Signed)
 Cardiology Office Note   Date:  09/03/2024   ID:  Samantha Velez, H[place] 10-19-1966, MRN [id number]  PCP:  Fernand Fredy RAMAN, MD  Cardiologist:  Denyse Fernand, MD      History of Present Illness: Samantha Velez is a 58 y.o. female who presents for  Chief Complaint  Patient presents with   Follow-up    4 week follow up    Doing well.      Past Medical History:  Diagnosis Date   BV (bacterial vaginosis)    COVID    Fatty liver    Heart murmur    Lipoma, face    Mixed hyperlipidemia    Onychomycosis    Postcoital bleeding      Past Surgical History:  Procedure Laterality Date   ABDOMINAL HYSTERECTOMY     [place] bilateral salpingectomy   LIPOMA EXCISION     back     Current Outpatient Medications  Medication Sig Dispense Refill   dapagliflozin  propanediol (FARXIGA ) 10 MG TABS tablet Take 1 tablet (10 mg total) by mouth daily. 90 tablet 3   ibuprofen (ADVIL,MOTRIN) 200 MG tablet Take 200 mg by mouth every 6 (six) hours as needed. Headache     Multiple Vitamin (MULTIVITAMIN WITH MINERALS) TABS Take 1 tablet by mouth daily.     rosuvastatin  (CRESTOR ) 10 MG tablet TAKE 1 TABLET(10 MG) BY MOUTH DAILY 30 tablet 11   No current facility-administered medications for this visit.    Allergies:   Patient has no known allergies.    Social History:   reports that she has never smoked. She has never been exposed to tobacco smoke. She has never used smokeless tobacco. She reports that she does not drink alcohol and does not use drugs.   Family History:  family history includes Breast cancer in her paternal aunt; Colon cancer in her paternal grandfather; Hypertension in her father.    ROS:     Review of Systems  Constitutional: Negative.   HENT: Negative.    Eyes: Negative.   Respiratory: Negative.    Gastrointestinal: Negative.   Genitourinary: Negative.   Musculoskeletal: Negative.   Skin: Negative.   Neurological: Negative.   Endo/Heme/Allergies: Negative.    Psychiatric/Behavioral: Negative.    All other systems reviewed and are negative.     All other systems are reviewed and negative.    PHYSICAL EXAM: VS:  BP 130/80   Pulse 71   Ht 5' 5 (1.651 m)   Wt 245 lb (111.1 kg)   SpO2 99%   BMI 40.77 kg/m  , BMI Body mass index is 40.77 kg/m. Last weight:  Wt Readings from Last 3 Encounters:  09/03/24 245 lb (111.1 kg)  08/10/24 246 lb 12.8 oz (111.9 kg)  08/06/24 251 lb 9.6 oz (114.1 kg)     Physical Exam Constitutional:      Appearance: Normal appearance.  Cardiovascular:     Rate and Rhythm: Normal rate and regular rhythm.     Heart sounds: Normal heart sounds.  Pulmonary:     Effort: Pulmonary effort is normal.     Breath sounds: Normal breath sounds.  Musculoskeletal:     Right lower leg: No edema.     Left lower leg: No edema.  Neurological:     Mental Status: She is alert.       EKG:   Recent Labs: 07/06/2024: ALT 24; BUN 11; Creatinine, Ser 0.83; Hemoglobin 12.7; Platelets 269; Potassium 4.1; Sodium 139; TSH 1.310  Lipid Panel    Component Value Date/Time   CHOL 157 07/06/2024 1354   TRIG 100 07/06/2024 1354   HDL 48 07/06/2024 1354   LDLCALC 91 07/06/2024 1354      Other studies Reviewed: Additional studies/ records that were reviewed today include:  Review of the above records demonstrates:       No data to display            ASSESSMENT AND PLAN:    ICD-10-CM   1. Shortness of breath  R06.02    Feels much better after taking farxiga . I feel better. Not tired.    2. Tricuspid valve insufficiency, unspecified etiology  I07.1    Mild MR, moderate TR, mild Pulm. HTN. better with farxiga .    3. Mixed hyperlipidemia  E78.2     4. Nonrheumatic mitral valve regurgitation  I34.0     5. Obesity (BMI 30-39.9)  E66.9        Problem List Items Addressed This Visit       Other   Mixed hyperlipidemia   Obesity (BMI 30-39.9)   Other Visit Diagnoses       Shortness of breath    -   Primary   Feels much better after taking farxiga . I feel better. Not tired.     Tricuspid valve insufficiency, unspecified etiology       Mild MR, moderate TR, mild Pulm. HTN. better with farxiga .     Nonrheumatic mitral valve regurgitation              Disposition:   Return in about 3 months (around 12/03/2024).    Total time spent: 30 minutes  Signed,  Denyse Bathe, MD  09/03/2024 3:06 PM    [facility]

## 2024-09-22 ENCOUNTER — Ambulatory Visit
Admission: RE | Admit: 2024-09-22 | Discharge: 2024-09-22 | Disposition: A | Discharge status: Home / Self Care | Payer: Self-pay | Source: Ambulatory Visit | Attending: Internal Medicine | Admitting: Internal Medicine

## 2024-09-22 DIAGNOSIS — Z1231 Encounter for screening mammogram for malignant neoplasm of breast: Secondary | ICD-10-CM | POA: Diagnosis present

## 2024-12-02 ENCOUNTER — Ambulatory Visit: Admitting: Cardiovascular Disease

## 2024-12-03 ENCOUNTER — Ambulatory Visit: Admitting: Cardiovascular Disease

## 2024-12-03 ENCOUNTER — Encounter: Payer: Self-pay | Admitting: Cardiovascular Disease

## 2024-12-03 VITALS — BP 130/76 | HR 77 | Ht 65.0 in | Wt 240.2 lb

## 2024-12-03 DIAGNOSIS — E782 Mixed hyperlipidemia: Secondary | ICD-10-CM | POA: Diagnosis not present

## 2024-12-03 DIAGNOSIS — R0602 Shortness of breath: Secondary | ICD-10-CM

## 2024-12-03 DIAGNOSIS — I340 Nonrheumatic mitral (valve) insufficiency: Secondary | ICD-10-CM | POA: Diagnosis not present

## 2024-12-03 DIAGNOSIS — I071 Rheumatic tricuspid insufficiency: Secondary | ICD-10-CM | POA: Diagnosis not present

## 2024-12-03 DIAGNOSIS — E669 Obesity, unspecified: Secondary | ICD-10-CM

## 2024-12-03 DIAGNOSIS — Z0130 Encounter for examination of blood pressure without abnormal findings: Secondary | ICD-10-CM

## 2024-12-03 NOTE — Progress Notes (Signed)
 Cardiology Office Note   Date:  12/03/2024   ID:  Samantha, Velez 05/31/1966, MRN [id number]  PCP:  Samantha Fredy RAMAN, MD  Cardiologist:  Denyse Fernand, MD      History of Present Illness: Samantha Velez is a 58 y.o. female who presents for  Chief Complaint  Patient presents with   Follow-up    3 month follow up    Doing fine, no complaints.      Past Medical History:  Diagnosis Date   BV (bacterial vaginosis)    COVID    Fatty liver    Heart murmur    Lipoma, face    Mixed hyperlipidemia    Onychomycosis    Postcoital bleeding      Past Surgical History:  Procedure Laterality Date   ABDOMINAL HYSTERECTOMY     [place] bilateral salpingectomy   LIPOMA EXCISION     back     Current Outpatient Medications  Medication Sig Dispense Refill   dapagliflozin  propanediol (FARXIGA ) 10 MG TABS tablet Take 1 tablet (10 mg total) by mouth daily. 90 tablet 3   ibuprofen (ADVIL,MOTRIN) 200 MG tablet Take 200 mg by mouth every 6 (six) hours as needed. Headache     Multiple Vitamin (MULTIVITAMIN WITH MINERALS) TABS Take 1 tablet by mouth daily.     rosuvastatin  (CRESTOR ) 10 MG tablet TAKE 1 TABLET(10 MG) BY MOUTH DAILY 30 tablet 11   No current facility-administered medications for this visit.    Allergies:   Patient has no known allergies.    Social History:   reports that she has never smoked. She has never been exposed to tobacco smoke. She has never used smokeless tobacco. She reports that she does not drink alcohol and does not use drugs.   Family History:  family history includes Breast cancer in her paternal aunt; Colon cancer in her paternal grandfather; Hypertension in her father.    ROS:     Review of Systems  Constitutional: Negative.   HENT: Negative.    Eyes: Negative.   Respiratory: Negative.    Gastrointestinal: Negative.   Genitourinary: Negative.   Musculoskeletal: Negative.   Skin: Negative.   Neurological: Negative.   Endo/Heme/Allergies:  Negative.   Psychiatric/Behavioral: Negative.    All other systems reviewed and are negative.     All other systems are reviewed and negative.    PHYSICAL EXAM: VS:  BP 130/76   Pulse 77   Ht 5' 5 (1.651 m)   Wt 240 lb 3.2 oz (109 kg)   SpO2 97%   BMI 39.97 kg/m  , BMI Body mass index is 39.97 kg/m. Last weight:  Wt Readings from Last 3 Encounters:  12/03/24 240 lb 3.2 oz (109 kg)  09/03/24 245 lb (111.1 kg)  08/10/24 246 lb 12.8 oz (111.9 kg)     Physical Exam Constitutional:      Appearance: Normal appearance.  Cardiovascular:     Rate and Rhythm: Normal rate and regular rhythm.     Heart sounds: Normal heart sounds.  Pulmonary:     Effort: Pulmonary effort is normal.     Breath sounds: Normal breath sounds.  Musculoskeletal:     Right lower leg: No edema.     Left lower leg: No edema.  Neurological:     Mental Status: She is alert.       EKG:   Recent Labs: 07/06/2024: ALT 24; BUN 11; Creatinine, Ser 0.83; Hemoglobin 12.7; Platelets 269; Potassium 4.1;  Sodium 139; TSH 1.310    Lipid Panel    Component Value Date/Time   CHOL 157 07/06/2024 1354   TRIG 100 07/06/2024 1354   HDL 48 07/06/2024 1354   LDLCALC 91 07/06/2024 1354      Other studies Reviewed: Additional studies/ records that were reviewed today include:  Review of the above records demonstrates:       No data to display            ASSESSMENT AND PLAN:    ICD-10-CM   1. Obesity (BMI 30-39.9)  E66.9     2. Nonrheumatic mitral valve regurgitation  I34.0     3. Mixed hyperlipidemia  E78.2     4. Shortness of breath  R06.02    SOB is better now. On farxiga .    5. Tricuspid valve insufficiency, unspecified etiology  I07.1        Problem List Items Addressed This Visit       Other   Mixed hyperlipidemia   Obesity (BMI 30-39.9) - Primary   Other Visit Diagnoses       Nonrheumatic mitral valve regurgitation         Shortness of breath       SOB is better now. On  farxiga .     Tricuspid valve insufficiency, unspecified etiology              Disposition:   Return in about 3 months (around 03/03/2025).    Total time spent: 35 minutes  Signed,  Denyse Bathe, MD  12/03/2024 3:52 PM    [facility]

## 2024-12-10 ENCOUNTER — Encounter: Payer: Self-pay | Admitting: Internal Medicine

## 2024-12-10 ENCOUNTER — Ambulatory Visit: Admitting: Internal Medicine

## 2024-12-10 VITALS — BP 132/78 | HR 64 | Ht 65.0 in | Wt 240.8 lb

## 2024-12-10 DIAGNOSIS — E669 Obesity, unspecified: Secondary | ICD-10-CM | POA: Diagnosis not present

## 2024-12-10 DIAGNOSIS — R0602 Shortness of breath: Secondary | ICD-10-CM | POA: Insufficient documentation

## 2024-12-10 DIAGNOSIS — Z0130 Encounter for examination of blood pressure without abnormal findings: Secondary | ICD-10-CM

## 2024-12-10 DIAGNOSIS — R5383 Other fatigue: Secondary | ICD-10-CM | POA: Insufficient documentation

## 2024-12-10 DIAGNOSIS — I340 Nonrheumatic mitral (valve) insufficiency: Secondary | ICD-10-CM | POA: Insufficient documentation

## 2024-12-10 DIAGNOSIS — E782 Mixed hyperlipidemia: Secondary | ICD-10-CM

## 2024-12-10 NOTE — Progress Notes (Signed)
 "  Established Patient Office Visit  Subjective:  Patient ID: Samantha Velez, female    DOB: 12/22/1966  Age: 58 y.o. MRN: [id number]  Chief Complaint  Patient presents with   Follow-up    4 month follow up    Patient is here today for follow up. She reports doing fairly well today and has no concerns to discuss. She reports taking her medications as prescribed and denies any side effects since recently starting Farxiga .  She is due for routine blood work today. Patients mammogram was completed 09/2024 Last colonoscopy was 2019 that was normal. Repeat due 05/2028. Patient is past due for Pap smear and will get completed at her next visit.  She denies chest pain, shortness of breath, palpitations, abdominal distress at this time.    No other concerns at this time.   Past Medical History:  Diagnosis Date   BV (bacterial vaginosis)    COVID    Fatty liver    Heart murmur    Lipoma, face    Mixed hyperlipidemia    Onychomycosis    Postcoital bleeding     Past Surgical History:  Procedure Laterality Date   ABDOMINAL HYSTERECTOMY     [place] bilateral salpingectomy   LIPOMA EXCISION     back    Social History   Socioeconomic History   Marital status: Married    Spouse name: Not on file   Number of children: Not on file   Years of education: Not on file   Highest education level: Not on file  Occupational History   Not on file  Tobacco Use   Smoking status: Never    Passive exposure: Never   Smokeless tobacco: Never  Vaping Use   Vaping status: Never Used  Substance and Sexual Activity   Alcohol use: No   Drug use: No   Sexual activity: Yes    Birth control/protection: Surgical  Other Topics Concern   Not on file  Social History Narrative   Not on file   Social Drivers of Health   Tobacco Use: Low Risk (12/10/2024)   Patient History    Smoking Tobacco Use: Never    Smokeless Tobacco Use: Never    Passive Exposure: Never  Financial Resource Strain: Not  on file  Food Insecurity: Not on file  Transportation Needs: Not on file  Physical Activity: Not on file  Stress: Not on file  Social Connections: Not on file  Intimate Partner Violence: Not on file  Depression (PHQ2-9): Low Risk (07/06/2024)   Depression (PHQ2-9)    PHQ-2 Score: 3  Alcohol Screen: Not on file  Housing: Not on file  Utilities: Not on file  Health Literacy: Not on file    Family History  Problem Relation Age of Onset   Hypertension Father    Breast cancer Paternal Aunt    Colon cancer Paternal Grandfather    Ovarian cancer Neg Hx     Allergies[1]  Show/hide medication list[2]  Review of Systems  Constitutional: Negative.  Negative for chills, fever and malaise/fatigue.  HENT: Negative.  Negative for congestion and sore throat.   Eyes: Negative.  Negative for blurred vision and pain.  Respiratory: Negative.  Negative for cough and shortness of breath.   Cardiovascular: Negative.  Negative for chest pain, palpitations and leg swelling.  Gastrointestinal: Negative.  Negative for abdominal pain, blood in stool, constipation, diarrhea, heartburn, melena, nausea and vomiting.  Genitourinary: Negative.  Negative for dysuria, flank pain, frequency and urgency.  Musculoskeletal: Negative.  Negative for joint pain and myalgias.  Skin: Negative.   Neurological: Negative.  Negative for dizziness, tingling, sensory change, weakness and headaches.  Endo/Heme/Allergies: Negative.   Psychiatric/Behavioral: Negative.  Negative for depression and suicidal ideas. The patient is not nervous/anxious.        Objective:   BP 132/78   Pulse 64   Ht 5' 5 (1.651 m)   Wt 240 lb 12.8 oz (109.2 kg)   SpO2 97%   BMI 40.07 kg/m   Vitals:   12/10/24 1256  BP: 132/78  Pulse: 64  Height: 5' 5 (1.651 m)  Weight: 240 lb 12.8 oz (109.2 kg)  SpO2: 97%  BMI (Calculated): 40.07    Physical Exam Vitals and nursing note reviewed.  Constitutional:      Appearance: Normal  appearance.  HENT:     Head: Normocephalic and atraumatic.     Nose: Nose normal.     Mouth/Throat:     Mouth: Mucous membranes are moist.     Pharynx: Oropharynx is clear.  Eyes:     Conjunctiva/sclera: Conjunctivae normal.     Pupils: Pupils are equal, round, and reactive to light.  Cardiovascular:     Rate and Rhythm: Normal rate and regular rhythm.     Pulses: Normal pulses.     Heart sounds: Murmur heard.  Pulmonary:     Effort: Pulmonary effort is normal.     Breath sounds: Normal breath sounds. No wheezing.  Abdominal:     General: Bowel sounds are normal.     Palpations: Abdomen is soft.     Tenderness: There is no abdominal tenderness. There is no right CVA tenderness or left CVA tenderness.  Musculoskeletal:        General: Normal range of motion.     Cervical back: Normal range of motion.     Right lower leg: No edema.     Left lower leg: No edema.  Skin:    General: Skin is warm and dry.  Neurological:     General: No focal deficit present.     Mental Status: She is alert and oriented to person, place, and time.  Psychiatric:        Mood and Affect: Mood normal.        Behavior: Behavior normal.      No results found for any visits on 12/10/24.  No results found for this or any previous visit (from the past 2160 hours).    Assessment & Plan:  Continue medications as prescribed. Check routine blood work today and FU with patient on results. Patient needs PAP at her next visit. Problem List Items Addressed This Visit       Cardiovascular and Mediastinum   Nonrheumatic mitral valve regurgitation   Relevant Orders   CBC with Diff     Other   Mixed hyperlipidemia   Relevant Orders   CMP14+EGFR   Lipid Panel w/o Chol/HDL Ratio   Obesity (BMI 30-39.9)   Fatigue - Primary   Relevant Orders   TSH    Return in about 3 months (around 03/10/2025) for pap smear and labs.   Total time spent: 25 minutes. This time includes review of previous notes and  results and patient face to face interaction during today's visit.    FERNAND FREDY RAMAN, MD  12/10/2024   This document may have been prepared by [facility] Voice Recognition software and as such may include unintentional dictation errors.     [1] No Known  Allergies [2]  Outpatient Medications Prior to Visit  Medication Sig   dapagliflozin  propanediol (FARXIGA ) 10 MG TABS tablet Take 1 tablet (10 mg total) by mouth daily.   ibuprofen (ADVIL,MOTRIN) 200 MG tablet Take 200 mg by mouth every 6 (six) hours as needed. Headache   Multiple Vitamin (MULTIVITAMIN WITH MINERALS) TABS Take 1 tablet by mouth daily.   rosuvastatin  (CRESTOR ) 10 MG tablet TAKE 1 TABLET(10 MG) BY MOUTH DAILY   No facility-administered medications prior to visit.   "

## 2024-12-11 LAB — CBC WITH DIFFERENTIAL/PLATELET
Basophils Absolute: 0 x10E3/uL (ref 0.0–0.2)
Basos: 0 %
EOS (ABSOLUTE): 0.1 x10E3/uL (ref 0.0–0.4)
Eos: 1 %
Hematocrit: 43 % (ref 34.0–46.6)
Hemoglobin: 13.8 g/dL (ref 11.1–15.9)
Immature Grans (Abs): 0 x10E3/uL (ref 0.0–0.1)
Immature Granulocytes: 0 %
Lymphocytes Absolute: 2.9 x10E3/uL (ref 0.7–3.1)
Lymphs: 32 %
MCH: 30.5 pg (ref 26.6–33.0)
MCHC: 32.1 g/dL (ref 31.5–35.7)
MCV: 95 fL (ref 79–97)
Monocytes Absolute: 0.6 x10E3/uL (ref 0.1–0.9)
Monocytes: 7 %
Neutrophils Absolute: 5.5 x10E3/uL (ref 1.4–7.0)
Neutrophils: 60 %
Platelets: 303 x10E3/uL (ref 150–450)
RBC: 4.53 x10E6/uL (ref 3.77–5.28)
RDW: 13.5 % (ref 11.7–15.4)
WBC: 9.2 x10E3/uL (ref 3.4–10.8)

## 2024-12-11 LAB — CMP14+EGFR
ALT: 25 IU/L (ref 0–32)
AST: 21 IU/L (ref 0–40)
Albumin: 4.1 g/dL (ref 3.8–4.9)
Alkaline Phosphatase: 96 IU/L (ref 49–135)
BUN/Creatinine Ratio: 11 (ref 9–23)
BUN: 10 mg/dL (ref 6–24)
Bilirubin Total: 0.4 mg/dL (ref 0.0–1.2)
CO2: 23 mmol/L (ref 20–29)
Calcium: 9.7 mg/dL (ref 8.7–10.2)
Chloride: 107 mmol/L — ABNORMAL HIGH (ref 96–106)
Creatinine, Ser: 0.91 mg/dL (ref 0.57–1.00)
Globulin, Total: 2.7 g/dL (ref 1.5–4.5)
Glucose: 75 mg/dL (ref 70–99)
Potassium: 4.3 mmol/L (ref 3.5–5.2)
Sodium: 146 mmol/L — ABNORMAL HIGH (ref 134–144)
Total Protein: 6.8 g/dL (ref 6.0–8.5)
eGFR: 74 mL/min/1.73

## 2024-12-11 LAB — LIPID PANEL W/O CHOL/HDL RATIO
Cholesterol, Total: 161 mg/dL (ref 100–199)
HDL: 53 mg/dL
LDL Chol Calc (NIH): 86 mg/dL (ref 0–99)
Triglycerides: 121 mg/dL (ref 0–149)
VLDL Cholesterol Cal: 22 mg/dL (ref 5–40)

## 2024-12-11 LAB — TSH: TSH: 1.42 u[IU]/mL (ref 0.450–4.500)

## 2024-12-13 ENCOUNTER — Ambulatory Visit: Payer: Self-pay | Admitting: Internal Medicine

## 2024-12-13 NOTE — Progress Notes (Signed)
 Patient notified

## 2025-03-04 ENCOUNTER — Ambulatory Visit: Admitting: Cardiovascular Disease

## 2025-03-10 ENCOUNTER — Ambulatory Visit: Admitting: Internal Medicine
